# Patient Record
Sex: Male | Born: 1960 | Hispanic: Yes | Marital: Married | Smoking: Never smoker
Health system: Southern US, Community
[De-identification: ages and names within clinical notes are randomized; demographics above are authoritative.]

## PROBLEM LIST (undated history)

## (undated) DIAGNOSIS — I10 Essential (primary) hypertension: Secondary | ICD-10-CM

## (undated) DIAGNOSIS — E119 Type 2 diabetes mellitus without complications: Secondary | ICD-10-CM

## (undated) DIAGNOSIS — I83892 Varicose veins of left lower extremities with other complications: Secondary | ICD-10-CM

## (undated) HISTORY — PX: KNEE SURGERY: SHX244

## (undated) HISTORY — PX: VARICOSE VEIN SURGERY: SHX832

## (undated) HISTORY — DX: Type 2 diabetes mellitus without complications: E11.9

---

## 2017-10-06 ENCOUNTER — Emergency Department (INDEPENDENT_AMBULATORY_CARE_PROVIDER_SITE_OTHER): Payer: Self-pay

## 2017-10-06 ENCOUNTER — Encounter: Payer: Self-pay | Admitting: *Deleted

## 2017-10-06 ENCOUNTER — Emergency Department
Admission: EM | Admit: 2017-10-06 | Discharge: 2017-10-06 | Disposition: A | Discharge status: Home / Self Care | Payer: Self-pay | Source: Home / Self Care

## 2017-10-06 DIAGNOSIS — R918 Other nonspecific abnormal finding of lung field: Secondary | ICD-10-CM

## 2017-10-06 DIAGNOSIS — J189 Pneumonia, unspecified organism: Secondary | ICD-10-CM

## 2017-10-06 HISTORY — DX: Essential (primary) hypertension: I10

## 2017-10-06 MED ORDER — LEVOFLOXACIN 500 MG PO TABS: 500.0000 mg | ORAL_TABLET | Freq: Every day | ORAL | 0 refills | Status: DC

## 2017-10-06 MED ORDER — BENZONATATE 100 MG PO CAPS: 100.0000 mg | ORAL_CAPSULE | Freq: Three times a day (TID) | ORAL | 0 refills | Status: DC

## 2017-10-06 NOTE — Discharge Instructions (Signed)
You need to have a repeat chest xray in 4 weeks.  See your Physician for recheck

## 2017-10-06 NOTE — ED Triage Notes (Signed)
Pt c/o productive cough  X 3 wks. He stopped Lisinopril 2 wks ago per his PCP recommendation without relief. Denies fever.

## 2017-10-06 NOTE — ED Provider Notes (Signed)
[facility]    CSN: [id number] Arrival date & time: 10/06/17  [id number]     History   Chief Complaint Chief Complaint  Patient presents with  . Cough    HPI Frank Mcclain is a 57 y.o. male.   The history is provided by the patient. No language interpreter was used.  Cough  Cough characteristics:  Productive Sputum characteristics:  Nondescript Severity:  Moderate Onset quality:  Gradual Duration:  3 weeks Timing:  Constant Chronicity:  New Smoker: no   Context: not occupational exposure   Relieved by:  Nothing Worsened by:  Nothing Ineffective treatments:  None tried Pt has had a cough for several weeks.  Pt's MD stopped his lisinopril 3 weeks ago.  Pt complains of continued cough  Past Medical History:  Diagnosis Date  . Hypertension     There are no active problems to display for this patient.   History reviewed. No pertinent surgical history.     Home Medications    Prior to Admission medications   Medication Sig Start Date End Date Taking? Authorizing Provider  DICLOFENAC PO Take by mouth.   Yes [provider]  hydrochlorothiazide (MICROZIDE) 12.5 MG capsule Take 12.5 mg by mouth daily.   Yes [provider]  UNKNOWN TO PATIENT    Yes [provider]  levofloxacin (LEVAQUIN) 500 MG tablet Take 1 tablet (500 mg total) by mouth daily. 10/06/17   Elson AreasSofia, [place] Liskey K, PA-C    Family History History reviewed. No pertinent family history.  Social History Social History   Tobacco Use  . Smoking status: Never Smoker  . Smokeless tobacco: Never Used  Substance Use Topics  . Alcohol use: No    Frequency: Never  . Drug use: No     Allergies   Patient has no known allergies.   Review of Systems Review of Systems  Respiratory: Positive for cough.   All other systems reviewed and are negative.    Physical Exam Triage Vital Signs ED Triage Vitals  Enc Vitals Group     BP 10/06/17 1023 (!) 144/86     Pulse  Rate 10/06/17 1023 85     Resp 10/06/17 1023 18     Temp 10/06/17 1023 (!) 97.3 F (36.3 C)     Temp Source 10/06/17 1023 Oral     SpO2 10/06/17 1023 97 %     Weight 10/06/17 1024 248 lb (112.5 kg)     Height 10/06/17 1024 5\' 9"  (1.753 m)     Head Circumference --      Peak Flow --      Pain Score 10/06/17 1024 0     Pain Loc --      Pain Edu? --      Excl. in [place]? --    No data found.  Updated Vital Signs BP (!) 144/86 (BP Location: Right Arm)   Pulse 85   Temp (!) 97.3 F (36.3 C) (Oral)   Resp 18   Ht 5\' 9"  (1.753 m)   Wt 248 lb (112.5 kg)   SpO2 97%   BMI 36.62 kg/m   Visual Acuity Right Eye Distance:   Left Eye Distance:   Bilateral Distance:    Right Eye Near:   Left Eye Near:    Bilateral Near:     Physical Exam  Constitutional: He appears well-developed and well-nourished.  HENT:  Head: Normocephalic and atraumatic.  Right Ear: External ear normal.  Left Ear: External ear normal.  Mouth/Throat: Oropharynx is clear and moist.  Eyes: Conjunctivae are normal.  Neck: Neck supple.  Cardiovascular: Normal rate and regular rhythm.  No murmur heard. Pulmonary/Chest: Effort normal and breath sounds normal. No respiratory distress.  Abdominal: Soft. There is no tenderness.  Musculoskeletal: He exhibits no edema.  Neurological: He is alert.  Skin: Skin is warm and dry.  Psychiatric: He has a normal mood and affect.  Nursing note and vitals reviewed.    UC Treatments / Results  Labs (all labs ordered are listed, but only abnormal results are displayed) Labs Reviewed - No data to display  EKG  EKG Interpretation None       Radiology Dg Chest 2 View  Result Date: 10/06/2017 CLINICAL DATA:  Three weeks of productive cough. History of hypertension. Nonsmoker. EXAM: CHEST  2 VIEW COMPARISON:  None in PACs FINDINGS: The lungs are adequately inflated. The interstitial markings are coarse. There is no alveolar infiltrate. There is no pleural effusion. The  heart and pulmonary vascularity are normal. The trachea is midline. The bony thorax exhibits no acute abnormality. IMPRESSION: No alveolar pneumonia nor pulmonary edema. Mild interstitial prominence in the lower lobes which may be acute or chronic. If acute this may reflect acute bronchitis or early interstitial pneumonia. If chronic it may reflect chronic bronchitis or interstitial lung disease. Followup PA and lateral chest X-ray is recommended in 3-4 weeks following trial of antibiotic therapy to ensure resolution and exclude underlying malignancy. Electronically Signed   By: David  [place] M.D.   On: 10/06/2017 11:13    Procedures Procedures (including critical care time)  Medications Ordered in UC Medications - No data to display   Initial Impression / Assessment and Plan / UC Course  I have reviewed the triage vital signs and the nursing notes.  Pertinent labs & imaging results that were available during my care of the patient were reviewed by me and considered in my medical decision making (see chart for details).     MDM  Chest xray reviewed, Pt counseled on results.  Pt advised of need for repeat chest xray in 4 weeks.  Pt given rx for levaquin and tessalon  Final Clinical Impressions(s) / UC Diagnoses   Final diagnoses:  Community acquired pneumonia, unspecified laterality    ED Discharge Orders        Ordered    levofloxacin (LEVAQUIN) 500 MG tablet  Daily     10/06/17 1152    benzonatate (TESSALON) 100 MG capsule  Every 8 hours     10/06/17 1205     An After Visit Summary was printed and given to the patient.  Controlled Substance Prescriptions [place] Controlled Substance Registry consulted? Not Applicable   Elson Areas, [place] 10/06/17 1207

## 2018-03-17 DIAGNOSIS — S83206A Unspecified tear of unspecified meniscus, current injury, right knee, initial encounter: Secondary | ICD-10-CM | POA: Insufficient documentation

## 2018-11-26 DIAGNOSIS — G894 Chronic pain syndrome: Secondary | ICD-10-CM | POA: Insufficient documentation

## 2018-11-26 DIAGNOSIS — M5136 Other intervertebral disc degeneration, lumbar region: Secondary | ICD-10-CM | POA: Insufficient documentation

## 2018-11-26 DIAGNOSIS — M48061 Spinal stenosis, lumbar region without neurogenic claudication: Secondary | ICD-10-CM | POA: Insufficient documentation

## 2018-11-26 DIAGNOSIS — M1711 Unilateral primary osteoarthritis, right knee: Secondary | ICD-10-CM | POA: Insufficient documentation

## 2019-08-24 ENCOUNTER — Other Ambulatory Visit: Payer: Self-pay

## 2019-08-24 ENCOUNTER — Encounter: Payer: Self-pay | Admitting: Emergency Medicine

## 2019-08-24 ENCOUNTER — Emergency Department
Admission: EM | Admit: 2019-08-24 | Discharge: 2019-08-24 | Disposition: A | Discharge status: Home / Self Care | Payer: Self-pay | Source: Home / Self Care | Attending: Family Medicine | Admitting: Family Medicine

## 2019-08-24 DIAGNOSIS — H6122 Impacted cerumen, left ear: Secondary | ICD-10-CM

## 2019-08-24 NOTE — ED Triage Notes (Signed)
LT ear tinnitus x 3 days, denies pain or dizziness

## 2019-08-24 NOTE — ED Provider Notes (Signed)
[facility]    CSN: [id number] Arrival date & time: 08/24/19  6948      History   Chief Complaint Chief Complaint  Patient presents with  . Tinnitus    HPI Frank Mcclain is a 59 y.o. male.   Patient complains of ringing in his left ear for 3 days without pain.  He describes it as a swishing sound.  He denies nasal congestion, fever, and no recent URI symptoms.  The history is provided by the patient.    Past Medical History:  Diagnosis Date  . Hypertension     There are no problems to display for this patient.   History reviewed. No pertinent surgical history.     Home Medications    Prior to Admission medications   Medication Sig Start Date End Date Taking? Authorizing Provider  DICLOFENAC PO Take by mouth.    [provider]  hydrochlorothiazide (MICROZIDE) 12.5 MG capsule Take 12.5 mg by mouth daily.    [provider]  UNKNOWN TO PATIENT     [provider]    Family History Family History  Problem Relation Age of Onset  . Hypertension Father     Social History Social History   Tobacco Use  . Smoking status: Never Smoker  . Smokeless tobacco: Never Used  Substance Use Topics  . Alcohol use: Yes  . Drug use: No     Allergies   Patient has no known allergies.   Review of Systems Review of Systems No sore throat No cough No pleuritic pain No wheezing No nasal congestion No post-nasal drainage No sinus pain/pressure No itchy/red eyes No earache, but has tinnitus left ear No hemoptysis No SOB No fever/chills No nausea No vomiting No abdominal pain No diarrhea No urinary symptoms No skin rash No fatigue No myalgias No headache   Physical Exam Triage Vital Signs ED Triage Vitals  Enc Vitals Group     BP 08/24/19 0906 (!) 143/87     Pulse Rate 08/24/19 0906 89     Resp --      Temp 08/24/19 0906 98.6 F (37 C)     Temp Source 08/24/19 0906 Oral     SpO2 08/24/19 0906 96 %   Weight 08/24/19 0907 260 lb (117.9 kg)     Height 08/24/19 0907 5\' 11"  (1.803 m)     Head Circumference --      Peak Flow --      Pain Score 08/24/19 0907 0     Pain Loc --      Pain Edu? --      Excl. in [place]? --    No data found.  Updated Vital Signs BP (!) 143/87 (BP Location: Right Arm)   Pulse 89   Temp 98.6 F (37 C) (Oral)   Ht 5\' 11"  (1.803 m)   Wt 117.9 kg   SpO2 96%   BMI 36.26 kg/m   Visual Acuity Right Eye Distance:   Left Eye Distance:   Bilateral Distance:    Right Eye Near:   Left Eye Near:    Bilateral Near:     Physical Exam Vitals and nursing note reviewed.  Constitutional:      General: He is not in acute distress.    Appearance: He is obese.  HENT:     Head: Normocephalic.     Right Ear: Tympanic membrane, ear canal and external ear normal.     Left Ear: External ear normal. There is  impacted cerumen.     Ears:     Comments: Post lavage, left canal and tympanic membrane appear normal    Nose: Nose normal.     Mouth/Throat:     Mouth: Mucous membranes are moist.  Eyes:     Pupils: Pupils are equal, round, and reactive to light.  Cardiovascular:     Rate and Rhythm: Normal rate.  Pulmonary:     Effort: Pulmonary effort is normal.  Musculoskeletal:     Cervical back: Normal range of motion.  Skin:    General: Skin is warm and dry.     Findings: No rash.  Neurological:     Mental Status: He is alert.      UC Treatments / Results  Labs (all labs ordered are listed, but only abnormal results are displayed) Labs Reviewed - No data to display  EKG   Radiology No results found.  Procedures Procedures (including critical care time)  Medications Ordered in UC Medications - No data to display  Initial Impression / Assessment and Plan / UC Course  I have reviewed the triage vital signs and the nursing notes.  Pertinent labs & imaging results that were available during my care of the patient were reviewed by me and considered in my  medical decision making (see chart for details).    Return for worsening symptoms.   Final Clinical Impressions(s) / UC Diagnoses   Final diagnoses:  Impacted cerumen of left ear   Discharge Instructions   None    ED Prescriptions    None        Lattie Haw, MD 08/24/19 [id number]

## 2019-10-31 ENCOUNTER — Emergency Department
Admission: EM | Admit: 2019-10-31 | Discharge: 2019-10-31 | Disposition: A | Discharge status: Home / Self Care | Payer: Self-pay | Source: Home / Self Care

## 2019-10-31 ENCOUNTER — Emergency Department: Payer: Self-pay

## 2019-10-31 ENCOUNTER — Emergency Department (INDEPENDENT_AMBULATORY_CARE_PROVIDER_SITE_OTHER): Payer: Self-pay

## 2019-10-31 ENCOUNTER — Other Ambulatory Visit: Payer: Self-pay

## 2019-10-31 DIAGNOSIS — I83892 Varicose veins of left lower extremities with other complications: Secondary | ICD-10-CM

## 2019-10-31 DIAGNOSIS — R05 Cough: Secondary | ICD-10-CM

## 2019-10-31 DIAGNOSIS — I803 Phlebitis and thrombophlebitis of lower extremities, unspecified: Secondary | ICD-10-CM

## 2019-10-31 DIAGNOSIS — R053 Chronic cough: Secondary | ICD-10-CM

## 2019-10-31 HISTORY — DX: Varicose veins of left lower extremity with other complications: I83.892

## 2019-10-31 MED ORDER — DOXYCYCLINE HYCLATE 100 MG PO CAPS: 100.0000 mg | ORAL_CAPSULE | Freq: Two times a day (BID) | ORAL | 0 refills | Status: DC

## 2019-10-31 NOTE — ED Provider Notes (Signed)
[facility]    CSN: [id number] Arrival date & time: 10/31/19  1003      History   Chief Complaint Chief Complaint  Patient presents with  . Leg Pain  . Cough    HPI Frank Mcclain is a 59 y.o. male.   HPI Frank Mcclain is a 59 y.o. male presenting to UC with family member c/o Left leg pain and swelling of his varicose veins of the last 1 week.  He has been less active due to the pain. He has also noticed redness and warmth of his left lower leg. Hx of surgery on his lower leg about 18 years ago but he does not recall the specifics of the surgery.    Pt also reports a mildly productive cough with yellow phlegm over the last 6 months, worse this past few weeks. Denies fever, chills, chest pain or SOB. No medication tried for his cough.  Denies chest pain, SOB, or palpitations.     Past Medical History:  Diagnosis Date  . Hypertension   . Varicose veins of left leg with edema     There are no problems to display for this patient.   Past Surgical History:  Procedure Laterality Date  . KNEE SURGERY    . VARICOSE VEIN SURGERY         Home Medications    Prior to Admission medications   Medication Sig Start Date End Date Taking? Authorizing Provider  amLODipine (NORVASC) 10 MG tablet Take by mouth. 12/12/17  Yes [provider]  gabapentin (NEURONTIN) 300 MG capsule Take by mouth. 11/26/18  Yes [provider]  DICLOFENAC PO Take by mouth.    [provider]  doxycycline (VIBRAMYCIN) 100 MG capsule Take 1 capsule (100 mg total) by mouth 2 (two) times daily. One po bid x 7 days 10/31/19   Lurene Shadow, PA-C  hydrochlorothiazide (MICROZIDE) 12.5 MG capsule Take 12.5 mg by mouth daily.    [provider]  UNKNOWN TO PATIENT     [provider]    Family History Family History  Problem Relation Age of Onset  . Hypertension Father     Social History Social History   Tobacco Use  . Smoking status: Never  Smoker  . Smokeless tobacco: Never Used  Substance Use Topics  . Alcohol use: Yes  . Drug use: No     Allergies   Patient has no known allergies.   Review of Systems Review of Systems  Constitutional: Negative for chills and fever.  HENT: Positive for congestion. Negative for ear pain, sore throat, trouble swallowing and voice change.   Respiratory: Positive for cough. Negative for shortness of breath.   Cardiovascular: Positive for leg swelling (Left). Negative for chest pain and palpitations.  Gastrointestinal: Negative for abdominal pain, diarrhea, nausea and vomiting.  Musculoskeletal: Positive for joint swelling and myalgias. Negative for arthralgias and back pain.  Skin: Positive for color change. Negative for rash.  All other systems reviewed and are negative.    Physical Exam Triage Vital Signs ED Triage Vitals [10/31/19 1026]  Enc Vitals Group     BP      Pulse      Resp      Temp      Temp src      SpO2      Weight 240 lb (108.9 kg)     Height 5\' 11"  (1.803 m)     Head Circumference  Peak Flow      Pain Score 5     Pain Loc      Pain Edu?      Excl. in [place]?    No data found.  Updated Vital Signs BP 109/76 (BP Location: Left Arm)   Pulse 100   Temp 98.2 F (36.8 C) (Oral)   Resp 20   Ht 5\' 11"  (1.803 m)   Wt 240 lb (108.9 kg)   SpO2 96%   BMI 33.47 kg/m   Visual Acuity Right Eye Distance:   Left Eye Distance:   Bilateral Distance:    Right Eye Near:   Left Eye Near:    Bilateral Near:     Physical Exam Vitals and nursing note reviewed.  Constitutional:      General: He is not in acute distress.    Appearance: Normal appearance. He is well-developed. He is obese. He is not ill-appearing, toxic-appearing or diaphoretic.  HENT:     Head: Normocephalic and atraumatic.  Cardiovascular:     Rate and Rhythm: Normal rate and regular rhythm.     Pulses:          Dorsalis pedis pulses are 2+ on the left side.  Pulmonary:     Effort:  Pulmonary effort is normal. No respiratory distress.     Breath sounds: Normal breath sounds. No stridor. No wheezing, rhonchi or rales.  Musculoskeletal:        General: Swelling and tenderness present. Normal range of motion.     Cervical back: Normal range of motion.     Comments: Right lower leg: normal exam Left lower leg: multiple prominent torturous varicose veins. Erythema and warmth to anterior lower leg. Tender. Moderate edema compared to Right leg.  Full ROM Left knee, foot and toes. No bony tenderness.  Skin:    General: Skin is warm and dry.     Capillary Refill: Capillary refill takes less than 2 seconds.  Neurological:     Mental Status: He is alert and oriented to person, place, and time.  Psychiatric:        Behavior: Behavior normal.      UC Treatments / Results  Labs (all labs ordered are listed, but only abnormal results are displayed) Labs Reviewed - No data to display  EKG   Radiology DG Chest 2 View  Result Date: 10/31/2019 CLINICAL DATA:  Chronic cough for 6 months. EXAM: CHEST - 2 VIEW COMPARISON:  10/06/2017 FINDINGS: Mild right hemidiaphragm elevation. Midline trachea. Normal heart size and mediastinal contours. No pleural effusion or pneumothorax. Clear lungs. IMPRESSION: No acute cardiopulmonary disease. Electronically Signed   By: 12/06/2017 M.D.   On: 10/31/2019 11:35   11/02/2019 Venous Img Lower Unilateral Left  Result Date: 10/31/2019 CLINICAL DATA:  59 year old with pain and swelling in the left lower extremity. History of previous treatment for varicose veins. EXAM: LEFT LOWER EXTREMITY VENOUS DOPPLER ULTRASOUND TECHNIQUE: Gray-scale sonography with graded compression, as well as color Doppler and duplex ultrasound were performed to evaluate the lower extremity deep venous systems from the level of the common femoral vein and including the common femoral, femoral, profunda femoral, popliteal and calf veins including the posterior tibial, peroneal and  gastrocnemius veins when visible. The superficial great saphenous vein was also interrogated. Spectral Doppler was utilized to evaluate flow at rest and with distal augmentation maneuvers in the common femoral, femoral and popliteal veins. COMPARISON:  None. FINDINGS: Contralateral Common Femoral Vein: Respiratory phasicity is normal and symmetric  with the symptomatic side. No evidence of thrombus. Normal compressibility. Common Femoral Vein: No evidence of thrombus. Normal compressibility, respiratory phasicity and response to augmentation. Saphenofemoral Junction: Not visualized and likely related to previous surgery. Profunda Femoral Vein: No evidence of thrombus. Normal compressibility and flow on color Doppler imaging. Femoral Vein: No evidence of thrombus. Normal compressibility, respiratory phasicity and response to augmentation. Popliteal Vein: No evidence of thrombus. Normal compressibility, respiratory phasicity and response to augmentation. Calf Veins: Visualized left deep calf veins are patent without thrombus. Other Findings: Left short saphenous vein is compressible. Multiple superficial varicosities in left calf. Majority of these varicose veins demonstrate normal compressibility. There is small amount of nonocclusive thrombus involving one of varicosities. IMPRESSION: 1.  Negative for deep venous thrombosis in left lower extremity. 2. Positive for nonocclusive superficial thrombus involving a varicose vein in the left calf. Multiple varicosities in the left calf compatible with underlying superficial venous insufficiency. Electronically Signed   By: Markus Daft M.D.   On: 10/31/2019 12:42    Procedures Procedures (including critical care time)  Medications Ordered in UC Medications - No data to display  Initial Impression / Assessment and Plan / UC Course  I have reviewed the triage vital signs and the nursing notes.  Pertinent labs & imaging results that were available during my care of  the patient were reviewed by me and considered in my medical decision making (see chart for details).     Discussed imaging with pt and family member Cough- may try OTC antihistamines as cough could be due to environmental allergies (Spring-high pollen count and lots of rain recently). No evidence of bacterial infection at this time.   Encouraged f/u with cardiovascular surgeon for further evaluation and treatment of severe varicose veins Advised pt he may need a referral from his PCP Discussed symptoms that warrant emergent care in the ED. AVS provided.  Final Clinical Impressions(s) / UC Diagnoses   Final diagnoses:  Phlebitis of left leg  Varicose veins of left leg with edema  Chronic cough   Discharge Instructions   None    ED Prescriptions    Medication Sig Dispense Auth. Provider   doxycycline (VIBRAMYCIN) 100 MG capsule Take 1 capsule (100 mg total) by mouth 2 (two) times daily. One po bid x 7 days 14 capsule Noe Gens, [place]     PDMP not reviewed this encounter.   Noe Gens, [place] 10/31/19 1711

## 2019-10-31 NOTE — ED Triage Notes (Signed)
Pt has varicose veins in left leg, and at night it hurts and swells.  Very painful and his activity has decreased the last week.   Pt has had a bad cough for about 6 months.  Has thick yellowish green mucous most of the time.

## 2019-11-04 ENCOUNTER — Other Ambulatory Visit: Payer: Self-pay

## 2019-11-04 ENCOUNTER — Telehealth: Payer: Self-pay

## 2019-11-04 ENCOUNTER — Emergency Department
Admission: EM | Admit: 2019-11-04 | Discharge: 2019-11-04 | Disposition: A | Discharge status: Home / Self Care | Payer: Self-pay | Source: Home / Self Care

## 2019-11-04 ENCOUNTER — Telehealth: Payer: Self-pay | Admitting: Emergency Medicine

## 2019-11-04 DIAGNOSIS — R7989 Other specified abnormal findings of blood chemistry: Secondary | ICD-10-CM

## 2019-11-04 DIAGNOSIS — R059 Cough, unspecified: Secondary | ICD-10-CM

## 2019-11-04 DIAGNOSIS — R42 Dizziness and giddiness: Secondary | ICD-10-CM

## 2019-11-04 DIAGNOSIS — R739 Hyperglycemia, unspecified: Secondary | ICD-10-CM

## 2019-11-04 DIAGNOSIS — R1110 Vomiting, unspecified: Secondary | ICD-10-CM

## 2019-11-04 DIAGNOSIS — R05 Cough: Secondary | ICD-10-CM

## 2019-11-04 DIAGNOSIS — R5383 Other fatigue: Secondary | ICD-10-CM

## 2019-11-04 LAB — CBC WITH DIFFERENTIAL/PLATELET
Absolute Monocytes: 392 cells/uL (ref 200–950)
Basophils Absolute: 20 cells/uL (ref 0–200)
Basophils Relative: 0.5 %
Eosinophils Absolute: 0 cells/uL — ABNORMAL LOW (ref 15–500)
Eosinophils Relative: 0 %
HCT: 50.9 % — ABNORMAL HIGH (ref 38.5–50.0)
Hemoglobin: 18.3 g/dL — ABNORMAL HIGH (ref 13.2–17.1)
Lymphs Abs: 2092 cells/uL (ref 850–3900)
MCH: 35.8 pg — ABNORMAL HIGH (ref 27.0–33.0)
MCHC: 36 g/dL (ref 32.0–36.0)
MCV: 99.6 fL (ref 80.0–100.0)
MPV: 10.7 fL (ref 7.5–12.5)
Monocytes Relative: 9.8 %
Neutro Abs: 1496 cells/uL — ABNORMAL LOW (ref 1500–7800)
Neutrophils Relative %: 37.4 %
Platelets: 136 10*3/uL — ABNORMAL LOW (ref 140–400)
RBC: 5.11 10*6/uL (ref 4.20–5.80)
RDW: 12.2 % (ref 11.0–15.0)
Total Lymphocyte: 52.3 %
WBC: 4 10*3/uL (ref 3.8–10.8)

## 2019-11-04 LAB — COMPLETE METABOLIC PANEL WITH GFR
AG Ratio: 1.4 (calc) (ref 1.0–2.5)
ALT: 184 U/L — ABNORMAL HIGH (ref 9–46)
AST: 142 U/L — ABNORMAL HIGH (ref 10–35)
Albumin: 3.8 g/dL (ref 3.6–5.1)
Alkaline phosphatase (APISO): 128 U/L (ref 35–144)
BUN: 15 mg/dL (ref 7–25)
CO2: 23 mmol/L (ref 20–32)
Calcium: 8.9 mg/dL (ref 8.6–10.3)
Chloride: 99 mmol/L (ref 98–110)
Creat: 0.77 mg/dL (ref 0.70–1.33)
GFR, Est African American: 117 mL/min/{1.73_m2} (ref >=60)
GFR, Est Non African American: 101 mL/min/{1.73_m2} (ref >=60)
Globulin: 2.8 g/dL (calc) (ref 1.9–3.7)
Glucose, Bld: 398 mg/dL — ABNORMAL HIGH (ref 65–99)
Potassium: 3.7 mmol/L (ref 3.5–5.3)
Sodium: 134 mmol/L — ABNORMAL LOW (ref 135–146)
Total Bilirubin: 1.4 mg/dL — ABNORMAL HIGH (ref 0.2–1.2)
Total Protein: 6.6 g/dL (ref 6.1–8.1)

## 2019-11-04 LAB — EXTRA SPECIMEN

## 2019-11-04 LAB — BRAIN NATRIURETIC PEPTIDE: Brain Natriuretic Peptide: 24 pg/mL (ref <100)

## 2019-11-04 MED ORDER — GENERIC EXTERNAL MEDICATION
Status: DC
Start: ? — End: 2019-11-04

## 2019-11-04 MED ORDER — AMLODIPINE BESYLATE 10 MG PO TABS: 10.0000 mg | ORAL_TABLET | Freq: Every day | ORAL | 0 refills | Status: DC

## 2019-11-04 MED ORDER — SODIUM CHLORIDE 0.9 % IV SOLN
10.00 | INTRAVENOUS | Status: DC
Start: ? — End: 2019-11-04

## 2019-11-04 NOTE — Discharge Instructions (Signed)
  It is important to follow up with family medicine for recheck of symptoms next week and for ongoing healthcare needs including blood pressure management.  Call 911 or go to emergency department for further evaluation if you develop chest pain, trouble breathing, passing out, or other new concerning symptoms develop.

## 2019-11-04 NOTE — ED Triage Notes (Signed)
Pt c/o cogh, vomiting, dizziness and fatigue x 1 week. Did receive J&J covid vaccine last thurs. Wife thinks he may have had some covid sxs prior to getting vaccine. No known exposure though.

## 2019-11-04 NOTE — ED Notes (Signed)
STAT LAB pick up at 2:30pm

## 2019-11-04 NOTE — ED Provider Notes (Signed)
Frank Mcclain CARE    CSN: [id number] Arrival date & time: 11/04/19  1237      History   Chief Complaint Chief Complaint  Patient presents with  . Cough  . Emesis  . Fatigue    HPI Frank Mcclain is a 59 y.o. male.   HPI  Frank Mcclain is a 59 y.o. male presenting to UC with wife with c/o 1 week of worsening minimally productive cough, dizziness, vomiting after cough "mainly phlegm" and fatigue.  He received his J&J vaccine the day pt started to feel a little fatigued last Thursday, 3/25.  She is unsure if his symptoms are from the vaccine or if he was sick prior to the vaccine.  No known exposure to Covid. No recent travel.  Wife states she has had a mild cough and fatigue but she gets chemo treatments on Thursdays so she figured she was fatigued from her chemo.   Pt was seen at [facility] Monday, 3/29 for reported chronic cough of 6 months and worsening Left lower leg swelling and varicose veins.  No mention of the J&J vaccine at that time.  Pt was started on doxycycline on Monday 3/29 for phlebitis of Left lower leg, concern for cellulitis.Pt denies chest pain, SOB, fever, or chills.   Past Medical History:  Diagnosis Date  . Hypertension   . Varicose veins of left leg with edema     There are no problems to display for this patient.   Past Surgical History:  Procedure Laterality Date  . KNEE SURGERY    . VARICOSE VEIN SURGERY         Home Medications    Prior to Admission medications   Medication Sig Start Date End Date Taking? Authorizing Provider  amLODipine (NORVASC) 10 MG tablet Take 1 tablet (10 mg total) by mouth daily for 10 days. 11/04/19 11/14/19  Noe Gens, PA-C  DICLOFENAC PO Take by mouth.    [provider]  doxycycline (VIBRAMYCIN) 100 MG capsule Take 1 capsule (100 mg total) by mouth 2 (two) times daily. One po bid x 7 days 10/31/19   Noe Gens, PA-C  gabapentin (NEURONTIN) 300 MG capsule Take by mouth. 11/26/18   [provider]  hydrochlorothiazide (MICROZIDE) 12.5 MG capsule Take 12.5 mg by mouth daily.    [provider]  UNKNOWN TO PATIENT     [provider]    Family History Family History  Problem Relation Age of Onset  . Hypertension Father     Social History Social History   Tobacco Use  . Smoking status: Never Smoker  . Smokeless tobacco: Never Used  Substance Use Topics  . Alcohol use: Yes  . Drug use: No     Allergies   Patient has no known allergies.   Review of Systems Review of Systems  Constitutional: Positive for fatigue. Negative for chills and fever.  HENT: Positive for congestion. Negative for ear pain, sore throat, trouble swallowing and voice change.   Respiratory: Positive for cough. Negative for shortness of breath.   Cardiovascular: Negative for chest pain and palpitations.  Gastrointestinal: Positive for vomiting. Negative for abdominal pain, diarrhea and nausea.  Musculoskeletal: Negative for arthralgias, back pain and myalgias.  Skin: Negative for rash.  Neurological: Positive for dizziness (mild). Negative for light-headedness and headaches.  All other systems reviewed and are negative.    Physical Exam Triage Vital Signs ED Triage Vitals  Enc Vitals Group     BP 11/04/19  1312 115/81     Pulse Rate 11/04/19 1312 95     Resp 11/04/19 1312 18     Temp 11/04/19 1312 98.1 F (36.7 C)     Temp Source 11/04/19 1312 Oral     SpO2 11/04/19 1312 95 %     Weight --      Height --      Head Circumference --      Peak Flow --      Pain Score 11/04/19 1314 0     Pain Loc --      Pain Edu? --      Excl. in GC? --    No data found.  Updated Vital Signs BP 115/81 (BP Location: Left Arm)   Pulse 95   Temp 98.1 F (36.7 C) (Oral)   Resp 18   SpO2 95%   Visual Acuity Right Eye Distance:   Left Eye Distance:   Bilateral Distance:    Right Eye Near:   Left Eye Near:    Bilateral Near:     Physical Exam Vitals and nursing  note reviewed.  Constitutional:      General: He is not in acute distress.    Appearance: Normal appearance. He is well-developed. He is not ill-appearing, toxic-appearing or diaphoretic.  HENT:     Head: Normocephalic and atraumatic.     Right Ear: Tympanic membrane and ear canal normal.     Left Ear: Tympanic membrane and ear canal normal.     Nose: Nose normal.     Right Sinus: No maxillary sinus tenderness or frontal sinus tenderness.     Left Sinus: No maxillary sinus tenderness or frontal sinus tenderness.     Mouth/Throat:     Lips: Pink.     Mouth: Mucous membranes are moist.     Pharynx: Oropharynx is clear. Uvula midline.  Cardiovascular:     Rate and Rhythm: Normal rate and regular rhythm.  Pulmonary:     Effort: Pulmonary effort is normal. No respiratory distress.     Breath sounds: Normal breath sounds.  Abdominal:     General: There is no distension.     Palpations: Abdomen is soft.     Tenderness: There is no abdominal tenderness.  Musculoskeletal:        General: Normal range of motion.     Cervical back: Normal range of motion.  Skin:    General: Skin is warm and dry.  Neurological:     Mental Status: He is alert and oriented to person, place, and time.  Psychiatric:        Behavior: Behavior normal.      UC Treatments / Results  Labs (all labs ordered are listed, but only abnormal results are displayed) Labs Reviewed  CBC WITH DIFFERENTIAL/PLATELET - Abnormal; Notable for the following components:      Result Value   Hemoglobin 18.3 (*)    HCT 50.9 (*)    MCH 35.8 (*)    Platelets 136 (*)    Neutro Abs 1,496 (*)    Eosinophils Absolute 0 (*)    All other components within normal limits  COMPLETE METABOLIC PANEL WITH GFR - Abnormal; Notable for the following components:   Glucose, Bld 398 (*)    Sodium 134 (*)    Total Bilirubin 1.4 (*)    AST 142 (*)    ALT 184 (*)    All other components within normal limits  NOVEL CORONAVIRUS, NAA  BRAIN  NATRIURETIC  PEPTIDE  EXTRA SPECIMEN    EKG   Radiology No results found.  Procedures Procedures (including critical care time)  Medications Ordered in UC Medications - No data to display  Initial Impression / Assessment and Plan / UC Course  I have reviewed the triage vital signs and the nursing notes.  Pertinent labs & imaging results that were available during my care of the patient were reviewed by me and considered in my medical decision making (see chart for details).     Concern for possible Covid. Covid test: pending.  Labs: glucose- 398, pt has no prior hx of diabetes LFTs also elevated Pt was instructed to go to the hospital for further evaluation as pt does not currently have a PCP he f/u with.  Final Clinical Impressions(s) / UC Diagnoses   Final diagnoses:  Cough  Fatigue, unspecified type  Post-tussive vomiting  Dizziness     Discharge Instructions      It is important to follow up with family medicine for recheck of symptoms next week and for ongoing healthcare needs including blood pressure management.  Call 911 or go to emergency department for further evaluation if you develop chest pain, trouble breathing, passing out, or other new concerning symptoms develop.    ED Prescriptions    Medication Sig Dispense Auth. Provider   amLODipine (NORVASC) 10 MG tablet Take 1 tablet (10 mg total) by mouth daily for 10 days. 10 tablet Lurene Shadow, PA-C     PDMP not reviewed this encounter.   Lurene Shadow, [place] 11/04/19 1754

## 2019-11-04 NOTE — Telephone Encounter (Signed)
Left msg for pt/emergency contact about elevated lab results. Per Denny Peon, pt should get further evaluated for blood sugar and liver enzyme levels.

## 2019-11-04 NOTE — Telephone Encounter (Signed)
Pts wife called back. Labs discussed. Strongly urged follow up at hospital. Pts wife agrees to take him in to [facility]

## 2019-11-05 LAB — NOVEL CORONAVIRUS, NAA: SARS-CoV-2, NAA: NOT DETECTED

## 2019-11-05 LAB — SARS-COV-2, NAA 2 DAY TAT

## 2019-11-05 MED ORDER — GENERIC EXTERNAL MEDICATION
Status: DC
Start: ? — End: 2019-11-05

## 2019-11-08 ENCOUNTER — Encounter: Payer: Self-pay | Admitting: Medical-Surgical

## 2019-11-08 ENCOUNTER — Ambulatory Visit (INDEPENDENT_AMBULATORY_CARE_PROVIDER_SITE_OTHER): Payer: Self-pay | Admitting: Medical-Surgical

## 2019-11-08 ENCOUNTER — Other Ambulatory Visit: Payer: Self-pay

## 2019-11-08 VITALS — BP 103/73 | HR 100 | Temp 98.0°F | Ht 67.0 in | Wt 237.7 lb

## 2019-11-08 DIAGNOSIS — G479 Sleep disorder, unspecified: Secondary | ICD-10-CM

## 2019-11-08 DIAGNOSIS — R600 Localized edema: Secondary | ICD-10-CM

## 2019-11-08 DIAGNOSIS — M25561 Pain in right knee: Secondary | ICD-10-CM

## 2019-11-08 DIAGNOSIS — J189 Pneumonia, unspecified organism: Secondary | ICD-10-CM

## 2019-11-08 DIAGNOSIS — I10 Essential (primary) hypertension: Secondary | ICD-10-CM

## 2019-11-08 DIAGNOSIS — I83812 Varicose veins of left lower extremities with pain: Secondary | ICD-10-CM

## 2019-11-08 DIAGNOSIS — E1165 Type 2 diabetes mellitus with hyperglycemia: Secondary | ICD-10-CM

## 2019-11-08 MED ORDER — GABAPENTIN 300 MG PO CAPS: 300.0000 mg | ORAL_CAPSULE | Freq: Two times a day (BID) | ORAL | 3 refills | Status: DC

## 2019-11-08 MED ORDER — AMLODIPINE BESYLATE 10 MG PO TABS: 10.0000 mg | ORAL_TABLET | Freq: Every day | ORAL | 3 refills | Status: DC

## 2019-11-08 MED ORDER — BLOOD GLUCOSE MONITOR KIT: PACK | 0 refills | Status: DC

## 2019-11-08 MED ORDER — FUROSEMIDE 20 MG PO TABS: 20.0000 mg | ORAL_TABLET | ORAL | 3 refills | Status: DC

## 2019-11-08 MED ORDER — HYDROCHLOROTHIAZIDE 25 MG PO TABS
25.0000 mg | ORAL_TABLET | ORAL | 3 refills | Status: DC
Start: 2019-11-08 — End: 2020-12-20

## 2019-11-08 MED ORDER — GLIPIZIDE 5 MG PO TABS: 5.0000 mg | ORAL_TABLET | Freq: Two times a day (BID) | ORAL | 3 refills | Status: DC

## 2019-11-08 MED ORDER — METFORMIN HCL 1000 MG PO TABS: 1000.0000 mg | ORAL_TABLET | Freq: Two times a day (BID) | ORAL | 3 refills | Status: DC

## 2019-11-08 NOTE — Progress Notes (Signed)
New Patient Office Visit  Subjective:  Patient ID: Frank Mcclain, male    DOB: 1961-07-10  Age: 59 y.o. MRN: [id number]  CC:  Chief Complaint  Patient presents with  . Establish Care  . Follow-up    [facility] 11/04/2019  . Diabetes  . Pneumonia    HPI Frank Mcclain presents to establish care and for hospital follow-up.  Patient was seen at [facility] on 11/04/2019 for new onset diabetes, bilateral pneumonia, and hypertension.   Diabetes-patient reports polyuria, polydipsia, dizziness, lethargy, and overall malaise prompting him to go to the hospital where his glucose level was at 600 on arrival. Started on Metformin and glipizide. Wife is diabetic and has been helping patient manage his blood sugars. She reports his fasting glucose yesterday morning was 220 with low readings of 150s throughout the afternoon. Fasting glucose this morning was 129. She reports he is a big fruit eater and they are working to adjust his diet   Pneumonia-also presented with chest tightness, severe cough and low-grade fever. Determined to have bilateral pneumonia, Covid negative. Currently on doxycycline twice daily for a 7-day course. Still running low-grade fevers approximately 99.5 resolved with as needed Tylenol. Endorses chills, continue chest tightness, severe productive cough with small amounts of hemoptysis. Denies shortness of breath but feels as if his breath is "taken away" when his cough is severe. Having some difficulty with sleeping at night.  Hypertension-blood pressure elevated at the hospital. Patient reports he ran out of his amlodipine and was given a 10-day supply on discharge. Not checking blood pressures at home, no cuff available.  Past Medical History:  Diagnosis Date  . Diabetes mellitus without complication ([place])   . Hypertension   . Varicose veins of left leg with edema     Past Surgical History:  Procedure Laterality Date  . KNEE SURGERY    . VARICOSE VEIN SURGERY      Family  History  Family history unknown: Yes    Social History   Socioeconomic History  . Marital status: Married    Spouse name: Not on file  . Number of children: Not on file  . Years of education: Not on file  . Highest education level: Not on file  Occupational History  . Not on file  Tobacco Use  . Smoking status: Never Smoker  . Smokeless tobacco: Never Used  Substance and Sexual Activity  . Alcohol use: Yes    Alcohol/week: 21.0 - 42.0 standard drinks    Types: 21 - 42 Cans of beer per week    Comment: 3-6 beers/day  . Drug use: No  . Sexual activity: Yes  Other Topics Concern  . Not on file  Social History Narrative  . Not on file   Social Determinants of Health   Financial Resource Strain:   . Difficulty of Paying Living Expenses:   Food Insecurity:   . Worried About Charity fundraiser in the Last Year:   . Arboriculturist in the Last Year:   Transportation Needs:   . Film/video editor (Medical):   Marland Kitchen Lack of Transportation (Non-Medical):   Physical Activity:   . Days of Exercise per Week:   . Minutes of Exercise per Session:   Stress:   . Feeling of Stress :   Social Connections:   . Frequency of Communication with Friends and Family:   . Frequency of Social Gatherings with Friends and Family:   . Attends Religious Services:   .  Active Member of Clubs or Organizations:   . Attends Archivist Meetings:   Marland Kitchen Marital Status:   Intimate Partner Violence:   . Fear of Current or Ex-Partner:   . Emotionally Abused:   Marland Kitchen Physically Abused:   . Sexually Abused:     ROS Review of Systems  Constitutional: Positive for chills and fever (low grade).  Respiratory: Positive for cough and chest tightness. Negative for shortness of breath and wheezing.   Cardiovascular: Positive for chest pain (left lower sternal borderf) and leg swelling. Negative for palpitations.  Endocrine: Positive for polydipsia and polyuria. Negative for polyphagia.  Genitourinary:  Positive for dysuria.    Objective:   Today's Vitals: BP 103/73   Pulse 100   Temp 98 F (36.7 C) (Oral)   Ht 5' 7"  (1.702 m)   Wt 237 lb 11.2 oz (107.8 kg)   SpO2 94%   BMI 37.23 kg/m   Physical Exam Vitals reviewed.  Constitutional:      General: He is not in acute distress.    Appearance: Normal appearance.  HENT:     Head: Normocephalic and atraumatic.  Cardiovascular:     Rate and Rhythm: Normal rate and regular rhythm.     Pulses: Normal pulses.     Heart sounds: Normal heart sounds. No murmur. No friction rub. No gallop.   Pulmonary:     Effort: Pulmonary effort is normal. No respiratory distress.     Comments: Strong cough triggered by deep breaths Musculoskeletal:     Left lower leg: Edema (mild) present.  Skin:    General: Skin is warm and dry.  Neurological:     Mental Status: He is alert and oriented to person, place, and time.  Psychiatric:        Mood and Affect: Mood normal.        Behavior: Behavior normal.        Thought Content: Thought content normal.        Judgment: Judgment normal.     Assessment & Plan:   1. Uncontrolled type 2 diabetes mellitus with hyperglycemia (HCC) Hemoglobin A1c 12.9 on labs drawn at [facility] on 11/04/2019. Continue glipizide 5 mg and Metformin 1000 mg twice daily, refills provided. Printed prescription for glucometer provided at patient request. They are self-pay so we are not sure if this will help with any kind of coverage for glucometer cost. - glipiZIDE (GLUCOTROL) 5 MG tablet; Take 1 tablet (5 mg total) by mouth in the morning and at bedtime.  Dispense: 180 tablet; Refill: 3 - metFORMIN (GLUCOPHAGE) 1000 MG tablet; Take 1 tablet (1,000 mg total) by mouth in the morning and at bedtime.  Dispense: 180 tablet; Refill: 3 - blood glucose meter kit and supplies KIT; Dispense based on patient and insurance preference. Use up to four times daily as directed. Please include lancets, test strips,  control solution.  Dispense: 1 each; Refill: 0  2. Essential hypertension Stable. Continue amlodipine 10 mg daily. Refill provided. - amLODipine (NORVASC) 10 MG tablet; Take 1 tablet (10 mg total) by mouth daily.  Dispense: 90 tablet; Refill: 3  3. Acute pain of right knee Continue gabapentin 300 mg twice daily, refill provided. - gabapentin (NEURONTIN) 300 MG capsule; Take 1 capsule (300 mg total) by mouth 2 (two) times daily.  Dispense: 180 capsule; Refill: 3  4. Lower extremity edema Continue furosemide 20 mg every other day and HCTZ 25 mg every other day as instructed. Refills provided. -  furosemide (LASIX) 20 MG tablet; Take 1 tablet (20 mg total) by mouth every other day. Opposite HCTZ  Dispense: 45 tablet; Refill: 3 - hydrochlorothiazide (HYDRODIURIL) 25 MG tablet; Take 1 tablet (25 mg total) by mouth every other day. Opposite furosemide  Dispense: 45 tablet; Refill: 3  5. Varicose veins of left lower extremity with pain Managed with furosemide, HCTZ, and gabapentin. - furosemide (LASIX) 20 MG tablet; Take 1 tablet (20 mg total) by mouth every other day. Opposite HCTZ  Dispense: 45 tablet; Refill: 3 - gabapentin (NEURONTIN) 300 MG capsule; Take 1 capsule (300 mg total) by mouth 2 (two) times daily.  Dispense: 180 capsule; Refill: 3 - hydrochlorothiazide (HYDRODIURIL) 25 MG tablet; Take 1 tablet (25 mg total) by mouth every other day. Opposite furosemide  Dispense: 45 tablet; Refill: 3  6. Pneumonia of both lungs due to infectious organism, unspecified part of lung Continue doxycycline twice daily until complete. Discussed importance of pushing fluids and doing deep breathing exercises. Avoiding cough suppressants at this time. May continue using as needed Tylenol for low-grade fever and discomfort. If worsening shortness of breath, high fevers, low blood pressures occur seek medical attention at the nearest urgent care or emergency room.  7. Sleeping difficulty Does not seem related  to coughing as patient reports he does not cough as much at night. More related to overall restlessness. Recommend trial of melatonin or other OTC sleep aid.  Discussed options available for help with medical care and expenses including charity care, medication, and leukocyte count [facility] office.  Outpatient Encounter Medications as of 11/08/2019  Medication Sig  . acetaminophen (TYLENOL) 500 MG tablet Take 500 mg by mouth every 6 (six) hours as needed.  Marland Kitchen amLODipine (NORVASC) 10 MG tablet Take 1 tablet (10 mg total) by mouth daily.  Marland Kitchen doxycycline (VIBRAMYCIN) 100 MG capsule Take 1 capsule (100 mg total) by mouth 2 (two) times daily. One po bid x 7 days  . furosemide (LASIX) 20 MG tablet Take 1 tablet (20 mg total) by mouth every other day. Opposite HCTZ  . gabapentin (NEURONTIN) 300 MG capsule Take 1 capsule (300 mg total) by mouth 2 (two) times daily.  Marland Kitchen glipiZIDE (GLUCOTROL) 5 MG tablet Take 1 tablet (5 mg total) by mouth in the morning and at bedtime.  . hydrochlorothiazide (HYDRODIURIL) 25 MG tablet Take 1 tablet (25 mg total) by mouth every other day. Opposite furosemide  . metFORMIN (GLUCOPHAGE) 1000 MG tablet Take 1 tablet (1,000 mg total) by mouth in the morning and at bedtime.  . [DISCONTINUED] amLODipine (NORVASC) 10 MG tablet Take 1 tablet (10 mg total) by mouth daily for 10 days.  . [DISCONTINUED] furosemide (LASIX) 20 MG tablet Take 20 mg by mouth every other day. Opposite HCTZ  . [DISCONTINUED] gabapentin (NEURONTIN) 300 MG capsule Take 300 mg by mouth 2 (two) times daily.   . [DISCONTINUED] glipiZIDE (GLUCOTROL) 5 MG tablet Take 1 tablet by mouth in the morning and at bedtime.  . [DISCONTINUED] hydrochlorothiazide (HYDRODIURIL) 25 MG tablet Take 25 mg by mouth every other day. Opposite furosemide  . [DISCONTINUED] metFORMIN (GLUCOPHAGE) 1000 MG tablet Take 1 tablet by mouth in the morning and at bedtime.  . blood glucose meter kit and supplies KIT Dispense based on patient and  insurance preference. Use up to four times daily as directed. Please include lancets, test strips, control solution.  . [DISCONTINUED] DICLOFENAC PO Take by mouth.  . [DISCONTINUED] UNKNOWN TO PATIENT    No facility-administered encounter medications on  file as of 11/08/2019.    Follow-up: Return in about 3 months (around 02/07/2020) for DM, HTN follow up.   Samuel Bouche, NP

## 2019-11-08 NOTE — Patient Instructions (Addendum)
Continue Doxycycline until finished  Try OTC sleep aids or melatonin.   Take deep breaths at least 10 every hour when awake.  Drink plenty of fluids.  Look up [facility] office for [facility] for medication cost help.

## 2019-11-22 ENCOUNTER — Telehealth: Payer: Self-pay | Admitting: Medical-Surgical

## 2019-11-22 NOTE — Telephone Encounter (Signed)
Ms. Frank Mcclain called. She said they were told to call if Mr. Frank Mcclain was having any issues: he is out of his pain med for his varicosed leg and pain has come back. Thank you.

## 2019-11-23 LAB — HEMOGLOBIN A1C: Hemoglobin A1C: 12.3

## 2019-11-23 NOTE — Telephone Encounter (Signed)
PTs wife called asking about aintibiotic medication to be refilled. Not pain medication. "Because Mohamadou is self pay Joy just told us to call in to refill the antibiotic medication"  Please advise.

## 2019-11-23 NOTE — Telephone Encounter (Signed)
Pain medication (gabapentin) was refilled on 11/08/2019 with 180 tablets so he should be good with that. There is no other pain medication on file for him.  As for the antibiotic, he completed a course of antibiotics for the pneumonia recently. Regular maintenance medications can be sent in without an appointment if appropriate but not antibiotics. I understand that he is a self-pay patient but if there are symptoms that require an antibiotic, then he will need to be evaluated.

## 2019-11-23 NOTE — Telephone Encounter (Signed)
Spoke with pts wife, Mrs. Sherral Hammers, and informed her of Joy's response. She said that it was not pain medication that she was asking about, it was the antibiotic. I informed her of what Joy said in regard to that and she was agreeable to scheduling an appt for him. Call transferred to the front desk for scheduling.

## 2019-11-24 ENCOUNTER — Ambulatory Visit (INDEPENDENT_AMBULATORY_CARE_PROVIDER_SITE_OTHER): Payer: Self-pay | Admitting: Medical-Surgical

## 2019-11-24 ENCOUNTER — Other Ambulatory Visit: Payer: Self-pay

## 2019-11-24 ENCOUNTER — Encounter: Payer: Self-pay | Admitting: Medical-Surgical

## 2019-11-24 VITALS — BP 109/75 | HR 87 | Temp 97.8°F | Ht 67.0 in | Wt 240.5 lb

## 2019-11-24 DIAGNOSIS — E1165 Type 2 diabetes mellitus with hyperglycemia: Secondary | ICD-10-CM | POA: Insufficient documentation

## 2019-11-24 DIAGNOSIS — E119 Type 2 diabetes mellitus without complications: Secondary | ICD-10-CM | POA: Insufficient documentation

## 2019-11-24 DIAGNOSIS — G479 Sleep disorder, unspecified: Secondary | ICD-10-CM | POA: Insufficient documentation

## 2019-11-24 DIAGNOSIS — J189 Pneumonia, unspecified organism: Secondary | ICD-10-CM | POA: Insufficient documentation

## 2019-11-24 DIAGNOSIS — R600 Localized edema: Secondary | ICD-10-CM | POA: Insufficient documentation

## 2019-11-24 DIAGNOSIS — I10 Essential (primary) hypertension: Secondary | ICD-10-CM | POA: Insufficient documentation

## 2019-11-24 DIAGNOSIS — I83812 Varicose veins of left lower extremities with pain: Secondary | ICD-10-CM | POA: Insufficient documentation

## 2019-11-24 MED ORDER — GABAPENTIN 300 MG PO CAPS: 600.0000 mg | ORAL_CAPSULE | Freq: Two times a day (BID) | ORAL | 3 refills | Status: DC

## 2019-11-24 MED ORDER — MELOXICAM 15 MG PO TABS: 15.0000 mg | ORAL_TABLET | Freq: Every day | ORAL | 0 refills | Status: DC

## 2019-11-24 NOTE — Progress Notes (Signed)
Subjective:    CC: Left lower extremity pain  HPI: Pleasant 59 year old Spanish-speaking male presenting today with continued left lower extremity pain.  History of severe varicose veins of the left lower extremity accompanied by pain and edema.  Recently treated for superficial thrombophlebitis with doxycycline x7 days.  Reports the pain in his leg resolved while on the antibiotic but has since returned.  Taking gabapentin 300 mg twice daily.  I reviewed the past medical history, family history, social history, surgical history, and allergies today and no changes were needed.  Please see the problem list section below in epic for further details.  Past Medical History: Past Medical History:  Diagnosis Date  . Diabetes mellitus without complication ([place])   . Hypertension   . Varicose veins of left leg with edema    Past Surgical History: Past Surgical History:  Procedure Laterality Date  . KNEE SURGERY    . VARICOSE VEIN SURGERY     Social History: Social History   Socioeconomic History  . Marital status: Married    Spouse name: Not on file  . Number of children: Not on file  . Years of education: Not on file  . Highest education level: Not on file  Occupational History  . Not on file  Tobacco Use  . Smoking status: Never Smoker  . Smokeless tobacco: Never Used  Substance and Sexual Activity  . Alcohol use: Yes    Alcohol/week: 21.0 - 42.0 standard drinks    Types: 21 - 42 Cans of beer per week    Comment: 3-6 beers/day  . Drug use: No  . Sexual activity: Yes  Other Topics Concern  . Not on file  Social History Narrative  . Not on file   Social Determinants of Health   Financial Resource Strain:   . Difficulty of Paying Living Expenses:   Food Insecurity:   . Worried About Charity fundraiser in the Last Year:   . Arboriculturist in the Last Year:   Transportation Needs:   . Film/video editor (Medical):   Marland Kitchen Lack of Transportation (Non-Medical):    Physical Activity:   . Days of Exercise per Week:   . Minutes of Exercise per Session:   Stress:   . Feeling of Stress :   Social Connections:   . Frequency of Communication with Friends and Family:   . Frequency of Social Gatherings with Friends and Family:   . Attends Religious Services:   . Active Member of Clubs or Organizations:   . Attends Archivist Meetings:   Marland Kitchen Marital Status:    Family History: Family History  Family history unknown: Yes   Allergies: No Known Allergies Medications: See med rec.  Review of Systems: No fevers, chills, night sweats, weight loss, chest pain, or shortness of breath.   Objective:    General: Well Developed, well nourished, and in no acute distress.  Neuro: Alert and oriented x3.  HEENT: Normocephalic, atraumatic.  Skin: Warm and dry. Cardiac: Regular rate and rhythm, no murmurs rubs or gallops, no lower extremity edema.  Respiratory: Clear to auscultation bilaterally. Not using accessory muscles, speaking in full sentences. Left lower extremity: No erythema, warmth, or alterations in skin.  Moderate nonpitting edema.  Multiple torturous varicose veins along the lower leg from knee to ankles.  Tenderness along the popliteal space.   Impression and Recommendations:    1. Varicose veins of left lower extremity with pain No acute indications of infection  or recurrent phlebitis at this time.  Suspect anti-inflammatory action of doxycycline helped with pain control.  Starting meloxicam 15 mg daily.  Advised to avoid other NSAIDs while taking this.  Also increase gabapentin to 600 mg twice daily.  Although they are self-pay, or strongly feel that he needs evaluation by vascular surgeon for these severe varicose veins.  Discussed referral with patient and wife and they are agreeable to proceed with this.  Referral entered. - Ambulatory referral to Vascular Surgery - gabapentin (NEURONTIN) 300 MG capsule; Take 2 capsules (600 mg total) by  mouth 2 (two) times daily.  Dispense: 180 capsule; Refill: 3   ___________________________________________ Thayer Ohm, DNP, APRN, FNP-BC Primary Care and Sports Medicine [facility]

## 2019-11-24 NOTE — Patient Instructions (Signed)
Venas varicosas Varicose Veins Las venas varicosas son venas que se han agrandado y abultado, y se han tornado sinuosas. Suelen aparecer en las piernas. Cules son las causas? La causa de esta afeccin es el dao en las vlvulas de la vena. Estas vlvulas ayudan a que la sangre regrese al corazn. Cuando estn daadas y dejan de funcionar correctamente, la sangre puede ir en direccin inversa y retornar a las venas cerca de la piel, lo que hace que las venas se agranden y se vean sinuosas. La afeccin puede surgir por cualquier factor que haga que la sangre retorne, como un embarazo, una actividad que obligue a estar de pie de forma prolongada o la obesidad. Qu incrementa el riesgo? Es ms probable que esta afeccin se manifieste en las personas con estas caractersticas:  Permanecen de pie mucho tiempo.  Estn embarazadas.  Tienen sobrepeso. Cules son los signos o los sntomas? Los sntomas de esta afeccin incluyen:  Venas abultadas, sinuosas y azuladas.  Sensacin de pesadez. Estos sntomas pueden empeorar hacia el final del da.  Dolor en las piernas. Estos sntomas pueden empeorar hacia el final del da.  Hinchazn en la pierna.  Cambios en el color de la piel que est sobre las venas. Cmo se diagnostica? Esta afeccin se puede diagnosticar en funcin de los sntomas, un examen fsico y una ecografa. Cmo se trata? El tratamiento de esta afeccin puede incluir lo siguiente:  Evitar estar sentado o de pie en la misma posicin durante mucho tiempo.  Usar medias de compresin. Estas medias ayudan a evitar la formacin de cogulos de sangre y a reducir la hinchazn de las piernas.  Levantar (elevar) las piernas al descansar.  Bajar de peso.  Hacer actividad fsica con regularidad. Si tiene sntomas persistentes o desea mejorar el aspecto de las venas varicosas, puede optar por un procedimiento para anular dichas venas o extraerlas. Entre los tratamientos para anular  las venas, se incluyen los siguientes:  Escleroterapia. En este tratamiento, se inyecta una solucin en la vena para anularla.  Tratamiento con lser. En este tratamiento, la vena se calienta con un lser para anularla.  Ablacin venosa por radiofrecuencia. En este tratamiento, se usa una corriente elctrica que se produce mediante ondas de radio para anular la vena. Entre los tratamientos para extraer las venas, se incluyen los siguientes:  Flebectoma. En este tratamiento, las venas se extraen a travs de pequeas incisiones que se realizan por encima de las venas.  Ligadura venosa y varicectoma. En este tratamiento, se realizan incisiones por encima de las venas. Luego, las venas se extraen despus de atarse (ligarse) con puntos (suturas). Siga estas instrucciones en su casa: Actividad  Camine todo lo que pueda. Caminar aumenta el flujo sanguneo. Esto ayuda a que la sangre regrese al corazn y alivia la presin en las venas. Tambin aumenta la fuerza cardiovascular.  Siga las instrucciones del mdico con respecto al ejercicio.  No permanezca de pie o sentado en una misma posicin durante mucho tiempo.  No se siente con las piernas cruzadas.  Descanse con las piernas levantadas durante el da. Instrucciones generales   Siga las instrucciones del mdico en lo que respecta a la dieta.  Use medias de compresin como se lo haya indicado su mdico. No use otra clase de vestimenta ajustada alrededor de las piernas, la pelvis o la cintura.  De noche, eleve las piernas a una altura superior al nivel del corazn.  Si se corta en la piel que est por encima de   una vena varicosa y esta sangra: ? Recustese con la pierna levantada. ? Coloque un pao limpio sobre el corte y presione firmemente sobre l hasta que el sangrado se detenga. ? Aplique una venda (vendaje) sobre el corte. Comunquese con un mdico si:  La piel alrededor de las venas varicosas empieza a agrietarse.  Siente  dolor o tiene enrojecimiento, sensibilidad o hinchazn dura sobre la vena.  Est incmodo debido al dolor.  Se corta en la piel de encima de una vena varicosa y no deja de sangrar. Resumen  Las venas varicosas son venas que se han agrandado y abultado, y se han tornado sinuosas. Suelen aparecer en las piernas.  La causa de esta afeccin es el dao en las vlvulas de la vena. Estas vlvulas ayudan a que la sangre regrese al corazn.  El tratamiento de esta afeccin incluye hacer movimientos frecuentes, usar medias de compresin, perder peso y hacer ejercicios de forma regular. En algunos casos, se realizan procedimientos que anulan o extraen las venas.  El tratamiento de esta afeccin puede incluir usar medias de compresin, elevar las piernas, perder peso y realizar actividades de forma regular. En algunos casos, se realizan procedimientos que anulan o extraen las venas. Esta informacin no tiene como fin reemplazar el consejo del mdico. Asegrese de hacerle al mdico cualquier pregunta que tenga. Document Revised: 10/13/2018 Document Reviewed: 10/13/2018 Elsevier Patient Education  2020 [employer]  

## 2019-12-23 ENCOUNTER — Encounter: Payer: Self-pay | Admitting: Medical-Surgical

## 2019-12-29 ENCOUNTER — Other Ambulatory Visit: Payer: Self-pay | Admitting: Medical-Surgical

## 2019-12-29 DIAGNOSIS — I83812 Varicose veins of left lower extremities with pain: Secondary | ICD-10-CM

## 2020-01-03 ENCOUNTER — Other Ambulatory Visit: Payer: Self-pay | Admitting: *Deleted

## 2020-01-03 DIAGNOSIS — I83893 Varicose veins of bilateral lower extremities with other complications: Secondary | ICD-10-CM

## 2020-01-05 ENCOUNTER — Encounter: Payer: Self-pay | Admitting: Vascular Surgery

## 2020-01-05 ENCOUNTER — Ambulatory Visit (HOSPITAL_COMMUNITY)
Admission: RE | Admit: 2020-01-05 | Discharge: 2020-01-05 | Disposition: A | Discharge status: Home / Self Care | Payer: Self-pay | Source: Ambulatory Visit | Attending: Surgery | Admitting: Surgery

## 2020-01-05 ENCOUNTER — Other Ambulatory Visit: Payer: Self-pay

## 2020-01-05 ENCOUNTER — Ambulatory Visit (INDEPENDENT_AMBULATORY_CARE_PROVIDER_SITE_OTHER): Payer: Self-pay | Admitting: Vascular Surgery

## 2020-01-05 VITALS — BP 113/71 | HR 71 | Temp 97.9°F | Resp 18 | Ht 69.0 in | Wt 244.0 lb

## 2020-01-05 DIAGNOSIS — I83893 Varicose veins of bilateral lower extremities with other complications: Secondary | ICD-10-CM | POA: Insufficient documentation

## 2020-01-05 DIAGNOSIS — I872 Venous insufficiency (chronic) (peripheral): Secondary | ICD-10-CM

## 2020-01-05 DIAGNOSIS — I83812 Varicose veins of left lower extremities with pain: Secondary | ICD-10-CM

## 2020-01-05 NOTE — Progress Notes (Signed)
REASON FOR CONSULT:    Painful varicose veins of the left lower extremity.  The consult is requested by Samuel Bouche, NP.  ASSESSMENT & PLAN:   PAINFUL VARICOSE VEINS LEFT LOWER EXTREMITY AND CHRONIC VENOUS INSUFFICIENCY: This patient has significant deep venous reflux on the left and also superficial venous reflux involving the small saphenous vein which has a thigh extension (vein of Giacomini) extending all the way up into the proximal thigh.  There is reflux throughout.  There is no connection to the popliteal vein that I can identify.  The patient has had a long history of leg swelling for as long as he can remember.  This is always been on the left side.  We have discussed the importance of intermittent leg elevation the proper positioning for this.  In addition I have recommended thigh-high compression stockings with a gradient of 20 to 30 mmHg and we will get him fitted for the stockings.  I have encouraged him to avoid prolonged sitting and standing.  We have discussed the importance of exercise specifically walking and water aerobics.  We also discussed the importance of maintaining a healthy weight.  I will plan on seeing him back in 3 months.  If at that time his symptoms have not improved significantly then I think he would be a candidate for endovenous laser ablation of the small saphenous vein and vein of Giacomini with greater than 20 stab phlebectomies.  Ideally I would want to consider a CT venogram to evaluate his outflow given the long history of left lower extremity swelling however the patient and his wife are concerned about the cost of this more extensive work-up and I certainly understand that.  For this reason we will hold off on a CT scan at this point.  I will plan on seeing him back in 3 months.  He knows to call sooner if he has problems.   Deitra Mayo, MD Office: [phone]   HPI:   Frank Mcclain is a pleasant 59 y.o. male, who has had left leg swelling for as  long as he can remember.  He denies any previous history of DVT.  He did have his great saphenous vein in the left leg stripped many years ago in [place].  He has not been wearing compression stockings.  He does elevate his legs which do help his symptoms.  He experiences significant aching and heaviness in his legs in addition to swelling and itching in the left leg.  His symptoms are worse at the end of the day.  His symptoms are relieved with elevation.  He denies any history of claudication, rest pain, or nonhealing ulcers.  His risk factors for peripheral vascular disease include diabetes and hypertension.  He denies any history of hypercholesterolemia, family history of premature cardiovascular disease, or smoking history.  Past Medical History:  Diagnosis Date   Diabetes mellitus without complication ([place])    Hypertension    Varicose veins of left leg with edema     Family History  Family history unknown: Yes    SOCIAL HISTORY: Social History   Socioeconomic History   Marital status: Married    Spouse name: Not on file   Number of children: Not on file   Years of education: Not on file   Highest education level: Not on file  Occupational History   Not on file  Tobacco Use   Smoking status: Never Smoker   Smokeless tobacco: Never Used  Substance and Sexual Activity  Alcohol use: Yes    Alcohol/week: 21.0 - 42.0 standard drinks    Types: 21 - 42 Cans of beer per week    Comment: 3-6 beers/day   Drug use: No   Sexual activity: Yes  Other Topics Concern   Not on file  Social History Narrative   Not on file   Social Determinants of Health   Financial Resource Strain:    Difficulty of Paying Living Expenses:   Food Insecurity:    Worried About Charity fundraiser in the Last Year:    Arboriculturist in the Last Year:   Transportation Needs:    Film/video editor (Medical):    Lack of Transportation (Non-Medical):   Physical Activity:      Days of Exercise per Week:    Minutes of Exercise per Session:   Stress:    Feeling of Stress :   Social Connections:    Frequency of Communication with Friends and Family:    Frequency of Social Gatherings with Friends and Family:    Attends Religious Services:    Active Member of Clubs or Organizations:    Attends Music therapist:    Marital Status:   Intimate Partner Violence:    Fear of Current or Ex-Partner:    Emotionally Abused:    Physically Abused:    Sexually Abused:     No Known Allergies  Current Outpatient Medications  Medication Sig Dispense Refill   acetaminophen (TYLENOL) 500 MG tablet Take 500 mg by mouth every 6 (six) hours as needed.     amLODipine (NORVASC) 10 MG tablet Take 1 tablet (10 mg total) by mouth daily. 90 tablet 3   blood glucose meter kit and supplies KIT Dispense based on patient and insurance preference. Use up to four times daily as directed. Please include lancets, test strips, control solution. 1 each 0   furosemide (LASIX) 20 MG tablet Take 1 tablet (20 mg total) by mouth every other day. Opposite HCTZ 45 tablet 3   gabapentin (NEURONTIN) 300 MG capsule Take 2 capsules (600 mg total) by mouth 2 (two) times daily. 180 capsule 3   glipiZIDE (GLUCOTROL) 5 MG tablet Take 1 tablet (5 mg total) by mouth in the morning and at bedtime. 180 tablet 3   hydrochlorothiazide (HYDRODIURIL) 25 MG tablet Take 1 tablet (25 mg total) by mouth every other day. Opposite furosemide 45 tablet 3   meloxicam (MOBIC) 15 MG tablet Take 1 tablet by mouth once daily 30 tablet 0   metFORMIN (GLUCOPHAGE) 1000 MG tablet Take 1 tablet (1,000 mg total) by mouth in the morning and at bedtime. 180 tablet 3   No current facility-administered medications for this visit.    REVIEW OF SYSTEMS:  [X]  denotes positive finding, [ ]  denotes negative finding Cardiac  Comments:  Chest pain or chest pressure:    Shortness of breath upon exertion:     Short of breath when lying flat:    Irregular heart rhythm:        Vascular    Pain in calf, thigh, or hip brought on by ambulation:    Pain in feet at night that wakes you up from your sleep:     Blood clot in your veins: x   Leg swelling:  x       Pulmonary    Oxygen at home:    Productive cough:     Wheezing:         Neurologic  Sudden weakness in arms or legs:     Sudden numbness in arms or legs:     Sudden onset of difficulty speaking or slurred speech:    Temporary loss of vision in one eye:     Problems with dizziness:         Gastrointestinal    Blood in stool:     Vomited blood:         Genitourinary    Burning when urinating:     Blood in urine:        Psychiatric    Major depression:         Hematologic    Bleeding problems:    Problems with blood clotting too easily:        Skin    Rashes or ulcers:        Constitutional    Fever or chills:     PHYSICAL EXAM:   Vitals:   01/05/20 1325  BP: 113/71  Pulse: 71  Resp: 18  Temp: 97.9 F (36.6 C)  TempSrc: Temporal  SpO2: 96%  Weight: 244 lb (110.7 kg)  Height: 5' 9"  (1.753 m)    GENERAL: The patient is a well-nourished male, in no acute distress. The vital signs are documented above. CARDIAC: There is a regular rate and rhythm.  VASCULAR: I do not detect carotid bruits. He has palpable dorsalis pedis pulses bilaterally. He has some large varicose veins along the lateral aspect of his left leg and posterior calf which are under significantly elevated pressure.  He also has reticular veins and spider veins. I looked at the small saphenous vein myself with the SonoSite and he has a vein of Giacomini extending all the way up to the medial proximal thigh.  There is significant reflux throughout.  These veins are under significantly elevated pressure.     RIGHT LEG calf 41 cm, ankle 24 cm. LEFT LEG: Calf 51 cm, ankle 31 cm. PULMONARY: There is good air exchange bilaterally without wheezing or  rales. ABDOMEN: Soft and non-tender with normal pitched bowel sounds.  MUSCULOSKELETAL: There are no major deformities or cyanosis. NEUROLOGIC: No focal weakness or paresthesias are detected. SKIN: There are no ulcers or rashes noted. PSYCHIATRIC: The patient has a normal affect.  DATA:    VENOUS DUPLEX: I have independently interpreted his venous duplex scan of the left lower extremity.  There is no evidence of DVT.  There is deep venous reflux involving the common femoral vein, femoral vein, and popliteal vein.  There is superficial venous reflux involving the small saphenous vein which is dilated with diameters ranging from 0.45-0.5 cm.  The patient has had previous laser ablation of the left great saphenous vein.

## 2020-01-19 DIAGNOSIS — I8393 Asymptomatic varicose veins of bilateral lower extremities: Secondary | ICD-10-CM

## 2020-01-29 ENCOUNTER — Other Ambulatory Visit: Payer: Self-pay | Admitting: Medical-Surgical

## 2020-01-29 DIAGNOSIS — I83812 Varicose veins of left lower extremities with pain: Secondary | ICD-10-CM

## 2020-02-27 ENCOUNTER — Other Ambulatory Visit: Payer: Self-pay | Admitting: Medical-Surgical

## 2020-02-27 DIAGNOSIS — I83812 Varicose veins of left lower extremities with pain: Secondary | ICD-10-CM

## 2020-04-05 ENCOUNTER — Other Ambulatory Visit: Payer: Self-pay | Admitting: Medical-Surgical

## 2020-04-05 DIAGNOSIS — I83812 Varicose veins of left lower extremities with pain: Secondary | ICD-10-CM

## 2020-07-12 ENCOUNTER — Other Ambulatory Visit: Payer: Self-pay | Admitting: Medical-Surgical

## 2020-07-12 DIAGNOSIS — I83812 Varicose veins of left lower extremities with pain: Secondary | ICD-10-CM

## 2020-07-12 DIAGNOSIS — R600 Localized edema: Secondary | ICD-10-CM

## 2020-09-05 ENCOUNTER — Other Ambulatory Visit: Payer: Self-pay | Admitting: Medical-Surgical

## 2020-09-05 DIAGNOSIS — I83812 Varicose veins of left lower extremities with pain: Secondary | ICD-10-CM

## 2020-09-23 ENCOUNTER — Other Ambulatory Visit: Payer: Self-pay | Admitting: Medical-Surgical

## 2020-09-23 DIAGNOSIS — I83812 Varicose veins of left lower extremities with pain: Secondary | ICD-10-CM

## 2020-12-01 ENCOUNTER — Other Ambulatory Visit: Payer: Self-pay

## 2020-12-01 ENCOUNTER — Encounter: Payer: Self-pay | Admitting: Emergency Medicine

## 2020-12-01 ENCOUNTER — Emergency Department
Admission: EM | Admit: 2020-12-01 | Discharge: 2020-12-01 | Disposition: A | Discharge status: Home / Self Care | Payer: Self-pay | Source: Home / Self Care

## 2020-12-01 ENCOUNTER — Emergency Department (INDEPENDENT_AMBULATORY_CARE_PROVIDER_SITE_OTHER): Payer: Self-pay

## 2020-12-01 DIAGNOSIS — R0602 Shortness of breath: Secondary | ICD-10-CM

## 2020-12-01 DIAGNOSIS — R059 Cough, unspecified: Secondary | ICD-10-CM

## 2020-12-01 DIAGNOSIS — M940 Chondrocostal junction syndrome [Tietze]: Secondary | ICD-10-CM

## 2020-12-01 DIAGNOSIS — J209 Acute bronchitis, unspecified: Secondary | ICD-10-CM

## 2020-12-01 MED ORDER — METHYLPREDNISOLONE SODIUM SUCC 125 MG IJ SOLR
125.0000 mg | Freq: Once | INTRAMUSCULAR | Status: AC
  Administered 2020-12-01: 125 mg via INTRAMUSCULAR

## 2020-12-01 MED ORDER — ALBUTEROL SULFATE HFA 108 (90 BASE) MCG/ACT IN AERS
2.0000 | INHALATION_SPRAY | Freq: Once | RESPIRATORY_TRACT | Status: AC
  Administered 2020-12-01: 2 via RESPIRATORY_TRACT

## 2020-12-01 MED ORDER — PREDNISONE 20 MG PO TABS: 40.0000 mg | ORAL_TABLET | Freq: Every day | ORAL | 0 refills | Status: AC

## 2020-12-01 MED ORDER — GUAIFENESIN-CODEINE 100-10 MG/5ML PO SYRP: 5.0000 mL | ORAL_SOLUTION | Freq: Three times a day (TID) | ORAL | 0 refills | Status: DC | PRN

## 2020-12-01 MED ORDER — BENZONATATE 100 MG PO CAPS: 100.0000 mg | ORAL_CAPSULE | Freq: Three times a day (TID) | ORAL | 0 refills | Status: DC

## 2020-12-01 NOTE — Discharge Instructions (Addendum)
Your x-ray was negative today for pneumonia.  I have sent in prednisone for you to take 2 tablets daily in the morning for 5 days.  This medication can cause her blood sugar to increase, so keep a close eye on your blood sugars at home.  I have sent in tessalon perles for you to use one capsule every 8 hours as needed for cough.  I have sent in cough syrup for you to take. This medication can make you sleepy. Do not drive while taking this medication.  You received a steroid injection in the office today to help open you up.  You have also received an albuterol inhaler in the office today.  You may use this every 4 hours as needed for cough, shortness of breath, wheezing.  Follow up with this office or with primary care if symptoms are persisting.  Follow up in the ER for high fever, trouble swallowing, trouble breathing, other concerning symptoms.

## 2020-12-01 NOTE — ED Triage Notes (Signed)
Cough x 2.5 weeks  OTC Nyquil  Denies fever  No COVID booster

## 2020-12-01 NOTE — ED Provider Notes (Signed)
Frank Mcclain   [id number] 12/01/20 Arrival Time: 0908   CC: COVID symptoms  SUBJECTIVE: History from: patient and family.  Frank Mcclain is a 60 y.o. male who presents with cough for the last 2-1/2 weeks.  Has been using NyQuil over-the-counter at night.  Has negative history of COVID.  Has had COVID vaccines, no booster.  No known COVID or flu exposure.  Has not completed flu vaccine this year. Denies recent travel.  States that the cough is worse at night and with activity.  Denies previous symptoms in the past. Denies fever, chills, fatigue, sinus pain, rhinorrhea, sore throat, SOB, wheezing, chest pain, nausea, changes in bowel or bladder habits.    ROS: As per HPI.  All other pertinent ROS negative.     Past Medical History:  Diagnosis Date  . Diabetes mellitus without complication ([place])   . Hypertension   . Varicose veins of left leg with edema    Past Surgical History:  Procedure Laterality Date  . KNEE SURGERY    . VARICOSE VEIN SURGERY     No Known Allergies No current facility-administered medications on file prior to encounter.   Current Outpatient Medications on File Prior to Encounter  Medication Sig Dispense Refill  . acetaminophen (TYLENOL) 500 MG tablet Take 500 mg by mouth every 6 (six) hours as needed.    Marland Kitchen amLODipine (NORVASC) 10 MG tablet Take 1 tablet (10 mg total) by mouth daily. 90 tablet 3  . blood glucose meter kit and supplies KIT Dispense based on patient and insurance preference. Use up to four times daily as directed. Please include lancets, test strips, control solution. 1 each 0  . furosemide (LASIX) 20 MG tablet TAKE 1 TABLET BY MOUTH EVERY OTHER DAY, OPPOSITE OF HCTZ  **PATIENT NEEDS OFFICE VISIT FOR ADDITIONAL REFILLS** 15 tablet 0  . gabapentin (NEURONTIN) 300 MG capsule Take 2 capsules (600 mg total) by mouth 2 (two) times daily. 180 capsule 3  . glipiZIDE (GLUCOTROL) 5 MG tablet Take 1 tablet (5 mg total) by mouth in the morning  and at bedtime. 180 tablet 3  . hydrochlorothiazide (HYDRODIURIL) 25 MG tablet Take 1 tablet (25 mg total) by mouth every other day. Opposite furosemide 45 tablet 3  . meloxicam (MOBIC) 15 MG tablet TAKE 1 TABLET BY MOUTH ONCE DAILY . APPOINTMENT REQUIRED FOR FUTURE REFILLS 15 tablet 0   Social History   Socioeconomic History  . Marital status: Married    Spouse name: Not on file  . Number of children: Not on file  . Years of education: Not on file  . Highest education level: Not on file  Occupational History  . Not on file  Tobacco Use  . Smoking status: Never Smoker  . Smokeless tobacco: Never Used  Vaping Use  . Vaping Use: Never used  Substance and Sexual Activity  . Alcohol use: Yes    Alcohol/week: 21.0 - 42.0 standard drinks    Types: 21 - 42 Cans of beer per week    Comment: 3-6 beers/day  . Drug use: No  . Sexual activity: Yes  Other Topics Concern  . Not on file  Social History Narrative  . Not on file   Social Determinants of Health   Financial Resource Strain: Not on file  Food Insecurity: Not on file  Transportation Needs: Not on file  Physical Activity: Not on file  Stress: Not on file  Social Connections: Not on file  Intimate Partner Violence: Not on file  Family History  Family history unknown: Yes    OBJECTIVE:  Vitals:   12/01/20 0923 12/01/20 0926  BP:  131/86  Pulse:  76  Resp:  17  Temp:  98.8 F (37.1 C)  TempSrc:  Oral  SpO2:  96%  Weight: 260 lb (117.9 kg)   Height: 5' 10"  (1.778 m)      General appearance: alert; appears fatigued, but nontoxic; speaking in full sentences and tolerating own secretions HEENT: NCAT; Ears: EACs clear, TMs pearly gray; Eyes: PERRL.  EOM grossly intact. Sinuses: nontender; Nose: nares patent with clear rhinorrhea, Throat: oropharynx erythematous, cobblestoning present, tonsils non erythematous or enlarged, uvula midline  Neck: supple without LAD Lungs: unlabored respirations, symmetrical air entry;  cough: moderate; no respiratory distress; wheezing noted to the lateral lung fields, diminished sounds to bilateral lower lobes Heart: regular rate and rhythm.  Radial pulses 2+ symmetrical bilaterally Skin: warm and dry Psychological: alert and cooperative; normal mood and affect  LABS:  No results found for this or any previous visit (from the past 24 hour(s)).   ASSESSMENT & PLAN:  1. Acute bronchitis, unspecified organism   2. Costochondritis   3. SOB (shortness of breath)     Meds ordered this encounter  Medications  . predniSONE (DELTASONE) 20 MG tablet    Sig: Take 2 tablets (40 mg total) by mouth daily with breakfast for 5 days.    Dispense:  10 tablet    Refill:  0    Order Specific Question:   Supervising Provider    Answer:   Chase Picket [id number]  . benzonatate (TESSALON) 100 MG capsule    Sig: Take 1 capsule (100 mg total) by mouth every 8 (eight) hours.    Dispense:  21 capsule    Refill:  0    Order Specific Question:   Supervising Provider    Answer:   Chase Picket [id number]  . guaiFENesin-codeine (ROBITUSSIN AC) 100-10 MG/5ML syrup    Sig: Take 5 mLs by mouth 3 (three) times daily as needed for cough.    Dispense:  120 mL    Refill:  0    Order Specific Question:   Supervising Provider    Answer:   Chase Picket [id number]  . methylPREDNISolone sodium succinate (SOLU-MEDROL) 125 mg/2 mL injection 125 mg  . albuterol (VENTOLIN HFA) 108 (90 Base) MCG/ACT inhaler 2 puff   Chest x-ray in office today negative for pneumonia Albuterol inhaler given in office today Solu-Medrol 125 mg IM given in office today Prescribed steroid course Prescribed benzonatate as needed during the day Prescribed Cheratussin as needed at night Continue supportive care at home Get plenty of rest and push fluids Use OTC zyrtec for nasal congestion, runny nose, and/or sore throat Use OTC flonase for nasal congestion and runny nose Use medications daily for symptom  relief Use OTC medications like ibuprofen or tylenol as needed fever or pain Call or go to the ED if you have any new or worsening symptoms such as fever, worsening cough, shortness of breath, chest tightness, chest pain, turning blue, changes in mental status.  Reviewed expectations re: course of current medical issues. Questions answered. Outlined signs and symptoms indicating need for more acute intervention. Patient verbalized understanding. After Visit Summary given.         Faustino Congress, NP 12/04/20 1644

## 2020-12-03 ENCOUNTER — Telehealth: Payer: Self-pay | Admitting: Medical-Surgical

## 2020-12-03 ENCOUNTER — Other Ambulatory Visit: Payer: Self-pay

## 2020-12-03 DIAGNOSIS — E1165 Type 2 diabetes mellitus with hyperglycemia: Secondary | ICD-10-CM

## 2020-12-03 MED ORDER — METFORMIN HCL 1000 MG PO TABS: 1000.0000 mg | ORAL_TABLET | Freq: Two times a day (BID) | ORAL | 0 refills | Status: DC

## 2020-12-03 NOTE — Telephone Encounter (Signed)
Spouse called. Mr Frank Mcclain needs a refill on his meds except Metformin.  Appt scheduled for May 20th.  Thank you

## 2020-12-03 NOTE — Telephone Encounter (Signed)
Rx was written 11/08/2019 to take twice daily and given #180 tablets with no refills. According to his chart, we have not filled this for him since then. LVM for wife TCB to find out if he has been compliant with regimen, and if so, how has he been getting refills.   Rx refill sent to pharmacy to bridge him until he comes in for his appt on 12/21/20

## 2020-12-04 NOTE — Telephone Encounter (Signed)
(  2nd attempt) LVM for wife TCB to find out if he has been compliant with regimen, and if so, how has he been getting refills. Rx was written 11/08/2019 to take twice daily and given #180 tablets with no refills. According to his chart, we have not filled this for him since then.   Rx refill sent to pharmacy to bridge him until he comes in for his appt on 12/21/20

## 2020-12-05 NOTE — Telephone Encounter (Signed)
LVMTRC (3rd attempt). Multiple attempts to contact patient and his wife, all unsuccessful. Unable to contact patient.  Need to find out if he has been compliant with regimen, and if so, how has he been getting refills. Rx was written 11/08/2019 to take twice daily and given #180 tablets with no refills. According to his chart, we have not filled this for him since then.   Rx refill sent to pharmacy to bridge him until he comes in for his appt on 12/21/20  .

## 2020-12-06 ENCOUNTER — Other Ambulatory Visit: Payer: Self-pay | Admitting: Medical-Surgical

## 2020-12-06 DIAGNOSIS — I10 Essential (primary) hypertension: Secondary | ICD-10-CM

## 2020-12-06 DIAGNOSIS — I83812 Varicose veins of left lower extremities with pain: Secondary | ICD-10-CM

## 2020-12-21 ENCOUNTER — Encounter: Payer: Self-pay | Admitting: Medical-Surgical

## 2020-12-21 ENCOUNTER — Other Ambulatory Visit: Payer: Self-pay

## 2020-12-21 ENCOUNTER — Ambulatory Visit (INDEPENDENT_AMBULATORY_CARE_PROVIDER_SITE_OTHER): Payer: Self-pay | Admitting: Medical-Surgical

## 2020-12-21 VITALS — BP 106/67 | HR 79 | Ht 70.0 in | Wt 272.0 lb

## 2020-12-21 DIAGNOSIS — Z114 Encounter for screening for human immunodeficiency virus [HIV]: Secondary | ICD-10-CM

## 2020-12-21 DIAGNOSIS — E1165 Type 2 diabetes mellitus with hyperglycemia: Secondary | ICD-10-CM

## 2020-12-21 DIAGNOSIS — Z1159 Encounter for screening for other viral diseases: Secondary | ICD-10-CM

## 2020-12-21 DIAGNOSIS — I10 Essential (primary) hypertension: Secondary | ICD-10-CM

## 2020-12-21 DIAGNOSIS — R600 Localized edema: Secondary | ICD-10-CM

## 2020-12-21 DIAGNOSIS — E118 Type 2 diabetes mellitus with unspecified complications: Secondary | ICD-10-CM

## 2020-12-21 DIAGNOSIS — I83812 Varicose veins of left lower extremities with pain: Secondary | ICD-10-CM

## 2020-12-21 DIAGNOSIS — Z23 Encounter for immunization: Secondary | ICD-10-CM

## 2020-12-21 DIAGNOSIS — Z6839 Body mass index (BMI) 39.0-39.9, adult: Secondary | ICD-10-CM

## 2020-12-21 DIAGNOSIS — Z1211 Encounter for screening for malignant neoplasm of colon: Secondary | ICD-10-CM

## 2020-12-21 LAB — POCT UA - MICROALBUMIN
Albumin/Creatinine Ratio, Urine, POC: <30
Creatinine, POC: 200 mg/dL
Microalbumin Ur, POC: 30 mg/L

## 2020-12-21 LAB — POCT GLYCOSYLATED HEMOGLOBIN (HGB A1C): Hemoglobin A1C: 6.3 % — AB (ref 4.0–5.6)

## 2020-12-21 MED ORDER — AMLODIPINE BESYLATE 10 MG PO TABS: 1.0000 | ORAL_TABLET | Freq: Every day | ORAL | 3 refills | Status: DC

## 2020-12-21 MED ORDER — FUROSEMIDE 20 MG PO TABS: ORAL_TABLET | ORAL | 3 refills | Status: DC

## 2020-12-21 MED ORDER — MELOXICAM 15 MG PO TABS: ORAL_TABLET | ORAL | 3 refills | Status: DC

## 2020-12-21 MED ORDER — GLIPIZIDE 5 MG PO TABS: 5.0000 mg | ORAL_TABLET | Freq: Every day | ORAL | 3 refills | Status: DC

## 2020-12-21 MED ORDER — METFORMIN HCL 1000 MG PO TABS: 1000.0000 mg | ORAL_TABLET | Freq: Every day | ORAL | 3 refills | Status: DC

## 2020-12-21 MED ORDER — HYDROCHLOROTHIAZIDE 25 MG PO TABS: 25.0000 mg | ORAL_TABLET | ORAL | 3 refills | Status: DC

## 2020-12-21 NOTE — Progress Notes (Signed)
Subjective:    CC: Chronic disease follow-up  HPI: Pleasant 60 year old male accompanied by his wife who serves as Nurse, learning disability presenting for chronic disease follow-up.  Diabetes-wife notes that they reduced his glipizide and metformin dosing to once daily instead of twice daily due to some hypoglycemic episodes.  Not checking his glucose regularly but they do check it if there seems to be a problem or if he is not feeling well.  Hypertension-taking amlodipine, HCTZ, and furosemide as prescribed, tolerating well.  They do occasionally check his blood pressure at home with similar readings to today.  Lower extremity edema/varicose veins-taking HCTZ and furosemide as prescribed.  They did get in with vascular who was not worried and advised them just to continue with compression stockings and let them know if they have any problems or new symptoms arise.  I reviewed the past medical history, family history, social history, surgical history, and allergies today and no changes were needed.  Please see the problem list section below in epic for further details.  Past Medical History: Past Medical History:  Diagnosis Date  . Diabetes mellitus without complication (HCC)   . Hypertension   . Varicose veins of left leg with edema    Past Surgical History: Past Surgical History:  Procedure Laterality Date  . KNEE SURGERY    . VARICOSE VEIN SURGERY     Social History: Social History   Socioeconomic History  . Marital status: Married    Spouse name: Not on file  . Number of children: Not on file  . Years of education: Not on file  . Highest education level: Not on file  Occupational History  . Not on file  Tobacco Use  . Smoking status: Never Smoker  . Smokeless tobacco: Never Used  Vaping Use  . Vaping Use: Never used  Substance and Sexual Activity  . Alcohol use: Yes    Alcohol/week: 21.0 - 42.0 standard drinks    Types: 21 - 42 Cans of beer per week    Comment: 3-6 beers/day   . Drug use: No  . Sexual activity: Yes  Other Topics Concern  . Not on file  Social History Narrative  . Not on file   Social Determinants of Health   Financial Resource Strain: Not on file  Food Insecurity: Not on file  Transportation Needs: Not on file  Physical Activity: Not on file  Stress: Not on file  Social Connections: Not on file   Family History: Family History  Family history unknown: Yes   Allergies: No Known Allergies Medications: See med rec.  Review of Systems: See HPI for pertinent positives and negatives.   Objective:    General: Well Developed, well nourished, and in no acute distress.  Neuro: Alert and oriented x3.  HEENT: Normocephalic, atraumatic.  Skin: Warm and dry. Cardiac: Regular rate and rhythm, no murmurs rubs or gallops, no lower extremity edema.  Respiratory: Clear to auscultation bilaterally. Not using accessory muscles, speaking in full sentences.   Impression and Recommendations:    1. Essential hypertension Blood pressure looks great today at 106/67.  Continue amlodipine 10 mg daily and hydrochlorothiazide 25 mg every other day.  Continue furosemide 20 mg every other day, alternating with hydrochlorothiazide. - CBC with Differential/Platelet - COMPLETE METABOLIC PANEL WITH GFR - amLODipine (NORVASC) 10 MG tablet; Take 1 tablet (10 mg total) by mouth daily.  Dispense: 90 tablet; Refill: 3  2. Lower extremity edema Continue HCTZ and furosemide as ordered. - hydrochlorothiazide (HYDRODIURIL) 25  MG tablet; Take 1 tablet (25 mg total) by mouth every other day. Opposite furosemide  Dispense: 45 tablet; Refill: 3 - furosemide (LASIX) 20 MG tablet; TAKE 1 TABLET BY MOUTH EVERY OTHER DAY, OPPOSITE OF HCTZ  Dispense: 45 tablet; Refill: 3  3. Varicose veins of left lower extremity with pain Continue diuretics as ordered.  Continue meloxicam 15 mg daily as needed. - hydrochlorothiazide (HYDRODIURIL) 25 MG tablet; Take 1 tablet (25 mg total)  by mouth every other day. Opposite furosemide  Dispense: 45 tablet; Refill: 3 - meloxicam (MOBIC) 15 MG tablet; TAKE 1 TABLET BY MOUTH ONCE DAILY.  Dispense: 90 tablet; Refill: 3 - furosemide (LASIX) 20 MG tablet; TAKE 1 TABLET BY MOUTH EVERY OTHER DAY, OPPOSITE OF HCTZ  Dispense: 45 tablet; Refill: 3  4. Need for hepatitis C screening test Declined today due to cost concerns.  5. Screening for HIV (human immunodeficiency virus) Declined today due to cost concerns.  6. Need for pneumococcal vaccination Declined today due to cost concerns.  7. Screen for colon cancer Ordering Cologuard. - Cologuard  8. BMI 39.0-39.9,adult Checking TSH.  Recommend weight loss and regular exercise. - TSH  9. Controlled type 2 diabetes mellitus with complication, without long-term current use of insulin (HCC) A1c 6.3% today.  Since he had already reduced his dosing due to hypoglycemia, plan to continue glipizide 5 mg daily and metformin 1000 mg daily.  Point-of-care microalbumin completed.  Checking labs. - glipiZIDE (GLUCOTROL) 5 MG tablet; Take 1 tablet (5 mg total) by mouth daily before breakfast.  Dispense: 90 tablet; Refill: 3 - CBC with Differential/Platelet - COMPLETE METABOLIC PANEL WITH GFR - Lipid panel - POCT glycosylated hemoglobin (Hb A1C) - POCT UA - Microalbumin - metFORMIN (GLUCOPHAGE) 1000 MG tablet; Take 1 tablet (1,000 mg total) by mouth daily with breakfast.  Dispense: 90 tablet; Refill: 3  Return in about 6 months (around 06/23/2021) for chronic disease follow up. ___________________________________________ Thayer Ohm, DNP, APRN, FNP-BC Primary Care and Sports Medicine [facility] [place]

## 2020-12-22 LAB — COMPLETE METABOLIC PANEL WITH GFR
AG Ratio: 1.3 (calc) (ref 1.0–2.5)
ALT: 76 U/L — ABNORMAL HIGH (ref 9–46)
AST: 45 U/L — ABNORMAL HIGH (ref 10–35)
Albumin: 3.9 g/dL (ref 3.6–5.1)
Alkaline phosphatase (APISO): 96 U/L (ref 35–144)
BUN: 14 mg/dL (ref 7–25)
CO2: 24 mmol/L (ref 20–32)
Calcium: 8.8 mg/dL (ref 8.6–10.3)
Chloride: 104 mmol/L (ref 98–110)
Creat: 0.76 mg/dL (ref 0.70–1.33)
GFR, Est African American: 117 mL/min/{1.73_m2} (ref >=60)
GFR, Est Non African American: 101 mL/min/{1.73_m2} (ref >=60)
Globulin: 2.9 g/dL (calc) (ref 1.9–3.7)
Glucose, Bld: 163 mg/dL — ABNORMAL HIGH (ref 65–139)
Potassium: 3.9 mmol/L (ref 3.5–5.3)
Sodium: 137 mmol/L (ref 135–146)
Total Bilirubin: 0.9 mg/dL (ref 0.2–1.2)
Total Protein: 6.8 g/dL (ref 6.1–8.1)

## 2020-12-22 LAB — CBC WITH DIFFERENTIAL/PLATELET
Absolute Monocytes: 598 cells/uL (ref 200–950)
Basophils Absolute: 48 cells/uL (ref 0–200)
Basophils Relative: 0.7 %
Eosinophils Absolute: 150 cells/uL (ref 15–500)
Eosinophils Relative: 2.2 %
HCT: 49 % (ref 38.5–50.0)
Hemoglobin: 17.4 g/dL — ABNORMAL HIGH (ref 13.2–17.1)
Lymphs Abs: 2849 cells/uL (ref 850–3900)
MCH: 34.5 pg — ABNORMAL HIGH (ref 27.0–33.0)
MCHC: 35.5 g/dL (ref 32.0–36.0)
MCV: 97.2 fL (ref 80.0–100.0)
MPV: 9.2 fL (ref 7.5–12.5)
Monocytes Relative: 8.8 %
Neutro Abs: 3155 cells/uL (ref 1500–7800)
Neutrophils Relative %: 46.4 %
Platelets: 204 10*3/uL (ref 140–400)
RBC: 5.04 10*6/uL (ref 4.20–5.80)
RDW: 11.8 % (ref 11.0–15.0)
Total Lymphocyte: 41.9 %
WBC: 6.8 10*3/uL (ref 3.8–10.8)

## 2020-12-22 LAB — TSH: TSH: 0.61 mIU/L (ref 0.40–4.50)

## 2020-12-22 LAB — LIPID PANEL
Cholesterol: 171 mg/dL (ref <200)
HDL: 44 mg/dL (ref >=40)
LDL Cholesterol (Calc): 107 mg/dL (calc) — ABNORMAL HIGH
Non-HDL Cholesterol (Calc): 127 mg/dL (calc) (ref <130)
Total CHOL/HDL Ratio: 3.9 (calc) (ref <5.0)
Triglycerides: 100 mg/dL (ref <150)

## 2020-12-28 ENCOUNTER — Other Ambulatory Visit: Payer: Self-pay | Admitting: Medical-Surgical

## 2020-12-28 DIAGNOSIS — I83812 Varicose veins of left lower extremities with pain: Secondary | ICD-10-CM

## 2021-09-18 ENCOUNTER — Encounter: Payer: Self-pay | Admitting: Physician Assistant

## 2021-09-18 ENCOUNTER — Ambulatory Visit (INDEPENDENT_AMBULATORY_CARE_PROVIDER_SITE_OTHER): Payer: Self-pay | Admitting: Physician Assistant

## 2021-09-18 ENCOUNTER — Other Ambulatory Visit: Payer: Self-pay

## 2021-09-18 VITALS — Ht 70.0 in | Wt 270.0 lb

## 2021-09-18 DIAGNOSIS — I952 Hypotension due to drugs: Secondary | ICD-10-CM

## 2021-09-18 DIAGNOSIS — I739 Peripheral vascular disease, unspecified: Secondary | ICD-10-CM

## 2021-09-18 DIAGNOSIS — I10 Essential (primary) hypertension: Secondary | ICD-10-CM

## 2021-09-18 DIAGNOSIS — F4321 Adjustment disorder with depressed mood: Secondary | ICD-10-CM

## 2021-09-18 DIAGNOSIS — R42 Dizziness and giddiness: Secondary | ICD-10-CM

## 2021-09-18 DIAGNOSIS — E1165 Type 2 diabetes mellitus with hyperglycemia: Secondary | ICD-10-CM

## 2021-09-18 LAB — POCT GLYCOSYLATED HEMOGLOBIN (HGB A1C): Hemoglobin A1C: 5.6 % (ref 4.0–5.6)

## 2021-09-18 MED ORDER — AMLODIPINE BESYLATE 5 MG PO TABS: 5.0000 mg | ORAL_TABLET | Freq: Every day | ORAL | 0 refills | Status: DC

## 2021-09-18 NOTE — Patient Instructions (Addendum)
Decrease norvasc to 5mg  daily.  Keep everything else the same.  Will get ABI of left leg.   Hipotensin ortosttica Orthostatic Hypotension La presin arterial mide con cunta fuerza, o debilidad, la sangre circulante presiona contra las paredes de las arterias. La hipotensin ortosttica es una disminucin repentina de la presin arterial que ocurre al cambiar de posicin, como cuando se pone de pie despus de estar recostado. Las arterias son los vasos sanguneos que transportan la sangre desde el corazn hacia todas las partes del cuerpo. Cuando la presin arterial es demasiado baja, puede ser que no llegue suficiente sangre al cerebro o al resto de sus rganos. La hipotensin ortosttica puede causar vahdos, sudoracin, latidos cardacos rpidos, visin borrosa y desmayos. Estos sntomas requieren que se investigue ms la causa. Cules son las causas? La hipotensin ortosttica puede deberse a [employer], entre otras: Cambios repentinos en la postura, por ejemplo, ponerse de pie rpidamente despus de haber estado sentado o acostado. Prdida de sangre (anemia) o prdida de lquidos corporales (deshidratacin). Problemas cardacos, problemas neurolgicos o problemas hormonales. Embarazo. Envejecimiento. El riesgo de esta afeccin aumenta a medida que envejece. Infeccin grave (sepsis). Ciertos medicamentos, como medicamentos para la presin arterial alta o medicamentos que hacen que el cuerpo pierda el exceso de lquidos (diurticos). Cules son los signos o sntomas? Los sntomas de esta afeccin pueden incluir los siguientes: Debilidad, vahdos o mareos. Sudoracin. Visin borrosa. Cansancio (fatiga). Latidos cardacos rpidos. Desmayos, cuando los casos son graves. Cmo se diagnostica? Esta afeccin se diagnostica en funcin de lo siguiente: Los sntomas y los antecedentes mdicos. La medicin de la presin arterial. El mdico le controlar la presin arterial cuando usted  est: Acostado. Sentado. De pie. La lectura de la presin arterial se registra con dos nmeros, por ejemplo 120 sobre 80 (o 120/80). El Teacher, English as a foreign language (superior) es la presin sistlica. Es la medida de la presin de las arterias cuando el corazn late. El segundo nmero (inferior) es la presin diastlica. Es la medida de la presin en las arterias cuando el corazn se relaja entre latidos. La presin arterial se mide en una unidad llamada mmHg. Una presin arterial saludable para la mayora de los adultos es de 120/80 mmHg. La hipotensin ortosttica se define como una cada de 20 mmHg en la presin sistlica o una cada de 10 mmHg en la presin diastlica dentro de los 3 minutos de estar de pie. Entre las pruebas e informacin que pueden ayudar a Retail buyer la hipotensin ortosttica se incluyen las siguientes: Sus otros signos vitales, por ejemplo la frecuencia cardaca y Therapist, sports. Pruebas de [place]. Un electrocardiograma (ECG) o un ecocardiograma. Monitor Holter. Este es un dispositivo que se [place] y Development worker, community el ritmo cardaco de manera continua, generalmente durante 24 a 48 horas. Prueba de basculacin. Para realizar esta prueba, se lo sujeta de forma segura a una mesa que lo mover de una posicin [place] a una posicin vertical. Durante la prueba, se controlarn el ritmo cardaco y la presin arterial. Cmo se trata? El tratamiento para esta afeccin puede incluir lo siguiente: Cambios en la dieta. Estos cambios implican comer con ms sal (sodio) o tomar ms agua. Cambiar la dosis de ciertos medicamentos que toma y que podran bajar su presin arterial. Corregir el motivo subyacente de la hipotensin ortosttica. Usar medias de compresin. Medicamentos para elevar la presin arterial. Evitar las acciones desencadenantes de los sntomas. Siga estas instrucciones en su casa: Medicamentos Use los medicamentos de venta libre y los recetados solamente como se  lo haya indicado el  [place] instrucciones del mdico respecto del cambio de la dosis de sus medicamentos actuales, si corresponde. No deje de tomar los medicamentos ni modifique la dosis por su cuenta. Comida y bebida  Beber suficiente lquido como para Theatre manager la orina de color amarillo plido. Consuma la sal adicional nicamente como se lo hayan indicado. No agregue sal adicional a su dieta a menos que se lo indique el mdico. Haga comidas pequeas y frecuentes. Evite ponerse de pie de repente despus de comer. Instrucciones generales  Levntese despacio cuando est acostado o sentado. Esto posibilitar que la presin arterial se adapte. Evite las duchas calientes o el calor excesivo como se lo haya indicado el mdico. Haga actividad fsica regularmente como se lo haya indicado el mdico. Si tiene medias de compresin, selas como se lo hayan indicado. Concurra a [place]. Esto es importante. Comunquese con un mdico si: Tiene fiebre por ms de 2 a 3 das. Tiene ms sed que lo habitual. Se siente mareado o dbil. Solicite ayuda de inmediato si: Electronics engineer. Tiene latidos cardacos rpidos o irregulares. Est sudoroso o siente mareos. Siente que le falta el aire. Se desmaya. Tiene sntomas de un accidente cerebrovascular. BE FAST es una manera fcil de recordar las principales seales de advertencia de un accidente cerebrovascular: B: Balance (equilibrio). Los signos son mareos, dificultad repentina para caminar o prdida del equilibrio. E: Eyes (ojos). Los signos son problemas para ver o un cambio repentino en la visin. F: Face (rostro). Los signos son debilidad repentina o adormecimiento del rostro, o el rostro o el prpado que se caen hacia un lado. A: Arms (brazos). Los signos son debilidad o adormecimiento en un brazo. Esto sucede de repente y generalmente en un lado del cuerpo. S: Speech (habla). Los signos son dificultad para hablar, hablar  arrastrando las palabras o dificultad para comprender lo que las Patent examiner. T: Time (tiempo). Es tiempo de llamar al servicio de Multimedia programmer. Anote la hora a la que [employer] sntomas. Presenta otros signos de un accidente cerebrovascular, como los siguientes: Dolor de cabeza sbito e intenso que no tiene causa aparente. Nuseas o vmitos. Convulsiones. Estos sntomas pueden representar un problema grave que constituye Engineer, maintenance (IT). No espere a ver si los sntomas desaparecen. Solicite atencin mdica de inmediato. Comunquese con el servicio de emergencias de su localidad (911 en los [place]). No conduzca por sus propios medios [employer] hospital. Resumen La hipotensin ortosttica es la cada sbita de la presin arterial. Puede causar vahdos, sudoracin, latidos cardacos rpidos, visin borrosa y [place]. La hipotensin ortosttica se diagnostica [place] la presin arterial mientras est acostado, sentado y luego de pie. El tratamiento puede implicar cambiar su dieta, usar medias de compresin, sentarse lentamente, ajustar sus medicamentos o corregir el motivo subyacente de la hipotensin Sales executive. Obtenga ayuda de inmediato si tiene dolor de pecho, latidos cardacos rpidos o irregulares, o sntomas de un accidente cerebrovascular. Esta informacin no tiene Marine scientist el consejo del mdico. Asegrese de hacerle al mdico cualquier pregunta que tenga. Document Revised: 10/24/2020 Document Reviewed: 10/23/2020 Elsevier Patient Education  [place].

## 2021-09-18 NOTE — Progress Notes (Signed)
Subjective:    Patient ID: Frank Mcclain, male    DOB: 12/25/1960, 61 y.o.   MRN: [id number]  HPI Pt is a 61 yo obese male with T2DM, varicose veins who presents to the clinic with dizziness when bending over and then standing back up. He has noticed this more and more over the past few weeks. He does speak broken english and hard to understand everything he says. His wife did pass away 3 months ago. She was his world and helped him with all of his medications and everything. He denies any syncope, vision changes, headaches, leg pain. He is taking his medication. He does also complain of his left leg hurting when he walks from time to time. Resolves with rest.  .. Active Ambulatory Problems    Diagnosis Date Noted   Uncontrolled type 2 diabetes mellitus with hyperglycemia (HCC) 11/24/2019   Essential hypertension 11/24/2019   Lower extremity edema 11/24/2019   Sleeping difficulty 11/24/2019   Varicose veins of left lower extremity with pain 11/24/2019   Dizziness 09/20/2021   Grief 09/20/2021   Hypotension due to drugs 09/20/2021   Resolved Ambulatory Problems    Diagnosis Date Noted   Pneumonia of both lungs due to infectious organism 11/24/2019   Past Medical History:  Diagnosis Date   Diabetes mellitus without complication (HCC)    Hypertension    Varicose veins of left leg with edema     Review of Systems See HPI.     Objective:   Physical Exam Vitals reviewed.  Constitutional:      Appearance: Normal appearance. He is obese.  HENT:     Head: Normocephalic.     Right Ear: Tympanic membrane, ear canal and external ear normal. There is no impacted cerumen.     Left Ear: Tympanic membrane, ear canal and external ear normal. There is no impacted cerumen.     Nose: Nose normal.     Mouth/Throat:     Mouth: Mucous membranes are moist.  Eyes:     Extraocular Movements: Extraocular movements intact.     Conjunctiva/sclera: Conjunctivae normal.     Pupils: Pupils are  equal, round, and reactive to light.  Cardiovascular:     Rate and Rhythm: Normal rate and regular rhythm.     Pulses: Normal pulses.  Pulmonary:     Effort: Pulmonary effort is normal.  Musculoskeletal:     Right lower leg: No edema.     Left lower leg: Edema present.     Comments: Left leg non-pitting edema and more rusty color. Decreased pedal pulse on left.   Neurological:     General: No focal deficit present.     Mental Status: He is alert and oriented to person, place, and time.     Motor: No weakness.     Coordination: Coordination normal.     Gait: Gait normal.     Deep Tendon Reflexes: Reflexes normal.     Comments: Negative dix hallpike  Psychiatric:     Comments: Tearful when talking about his wife and her passing   Decreased left pedal pulse compared to right.   Marland Kitchen.Orthostatic Vitals for the past 48 hrs (Last 6 readings):  Orthostatic BP Orthostatic Pulse  09/18/21 1405 114/71 81   .Marland Kitchen Today's Vitals   09/18/21 1405  SpO2: 99%  Weight: 270 lb (122.5 kg)  Height: 5\' 10"  (1.778 m)   Body mass index is 38.74 kg/m.   .. Lab Results  Component Value Date  HGBA1C 5.6 09/18/2021       Assessment & Plan:  Marland KitchenMarland KitchenPage was seen today for dizziness.  Diagnoses and all orders for this visit:  Uncontrolled type 2 diabetes mellitus with hyperglycemia (HCC) -     POCT HgB A1C  Essential hypertension -     amLODipine (NORVASC) 5 MG tablet; Take 1 tablet (5 mg total) by mouth daily.  Dizziness  Hypotension due to drugs  Grief   No vertigo Suspect dizziness associated with baseline low BP and some orthostatic changes HO given Decreased norvasc to 5mg .  Discussed compression stockings A1C is great.  Left leg claudication and decreased pulse need ABI.  Declined to fill out PHQ-9 he stated he has a lot of people he can talk to about his sadness. Follow up with PCP in 1 month.

## 2021-09-20 DIAGNOSIS — I739 Peripheral vascular disease, unspecified: Secondary | ICD-10-CM | POA: Insufficient documentation

## 2021-09-20 DIAGNOSIS — I952 Hypotension due to drugs: Secondary | ICD-10-CM | POA: Insufficient documentation

## 2021-09-20 DIAGNOSIS — R42 Dizziness and giddiness: Secondary | ICD-10-CM | POA: Insufficient documentation

## 2021-09-20 DIAGNOSIS — F4321 Adjustment disorder with depressed mood: Secondary | ICD-10-CM | POA: Insufficient documentation

## 2021-09-20 NOTE — Progress Notes (Signed)
Printed ABI and sent to referral box.

## 2021-09-22 IMAGING — US US EXTREM LOW VENOUS*L*
1 series · 13 of 24 positions shown · non-contrast
Comparison: None.

CLINICAL DATA: 59-year-old with pain and swelling in the left lower
extremity. History of previous treatment for varicose veins.



[Series 1: us extrem low venous*left* · 0.08mm/px · 13 of 48 slices shown]
[im 1/48]
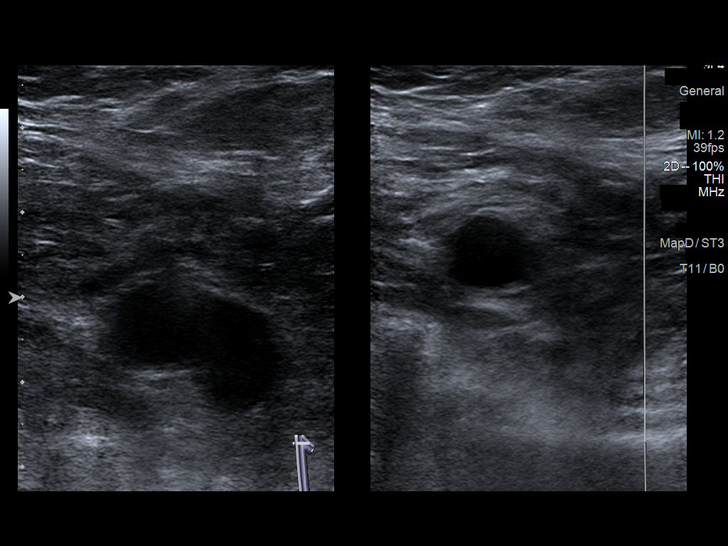
[im 5/48]
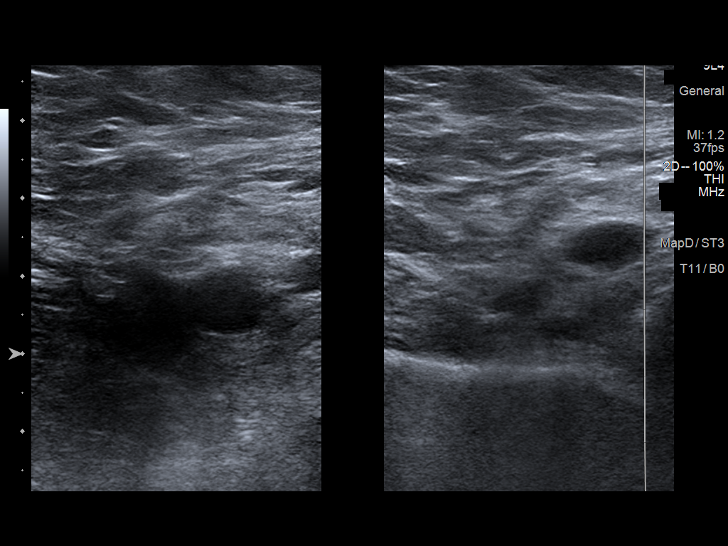
[im 9/48]
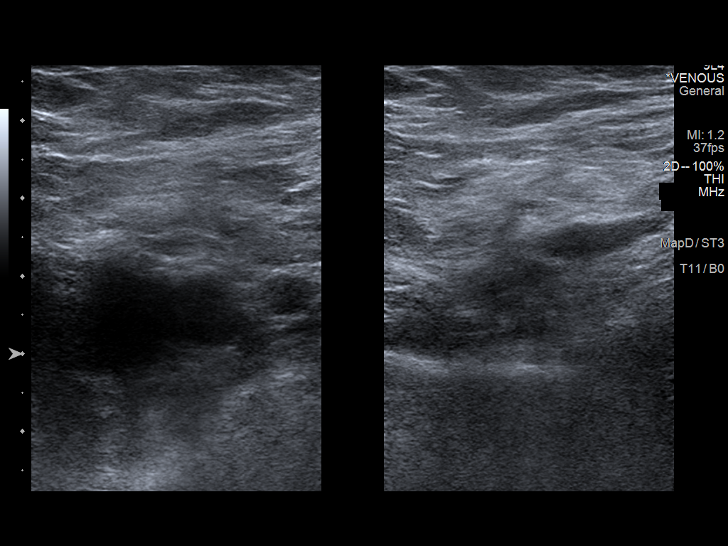
[im 13/48]
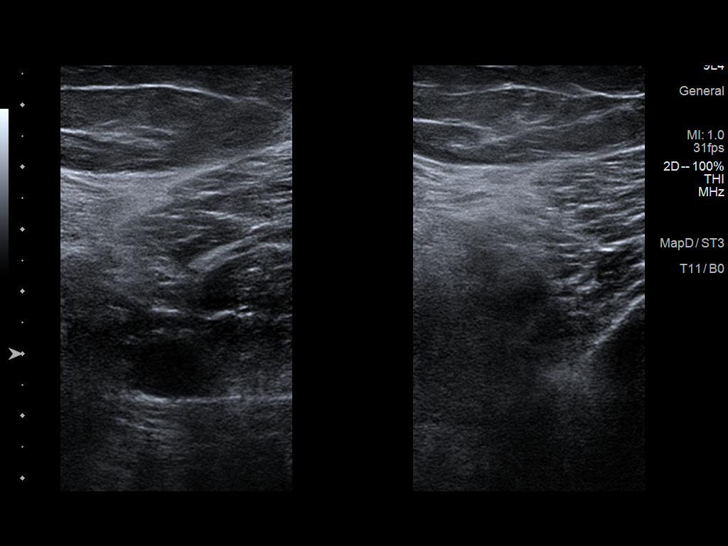
[im 17/48]
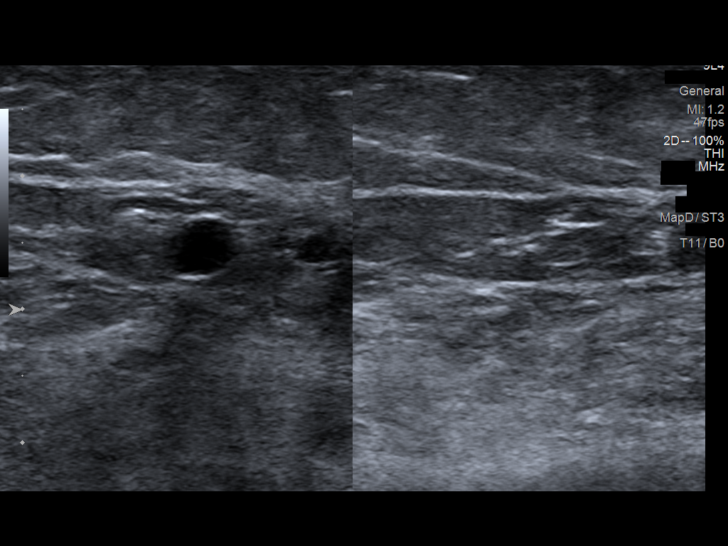
[im 21/48]
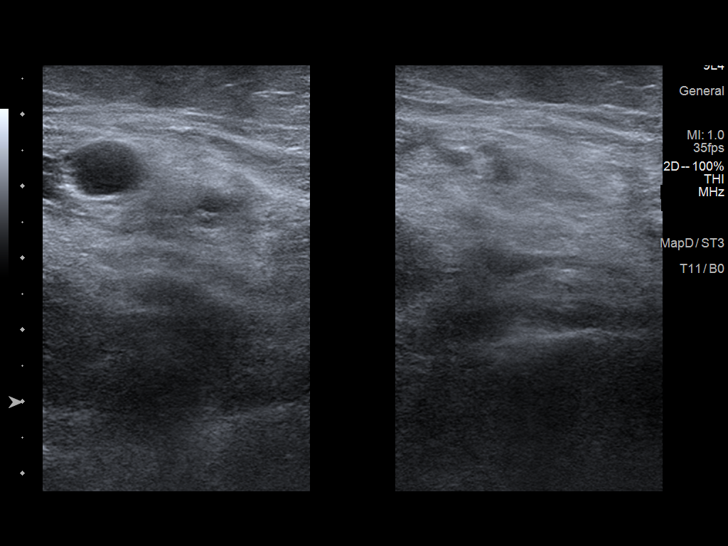
[im 25/48]
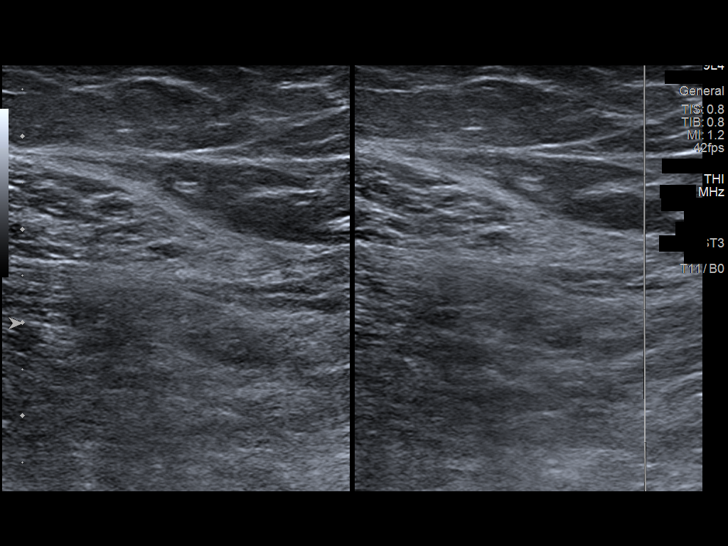
[im 27/48]
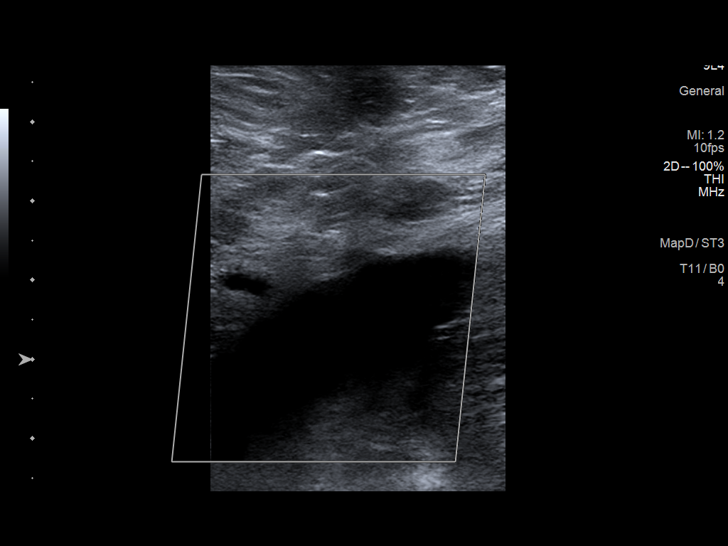
[im 31/48]
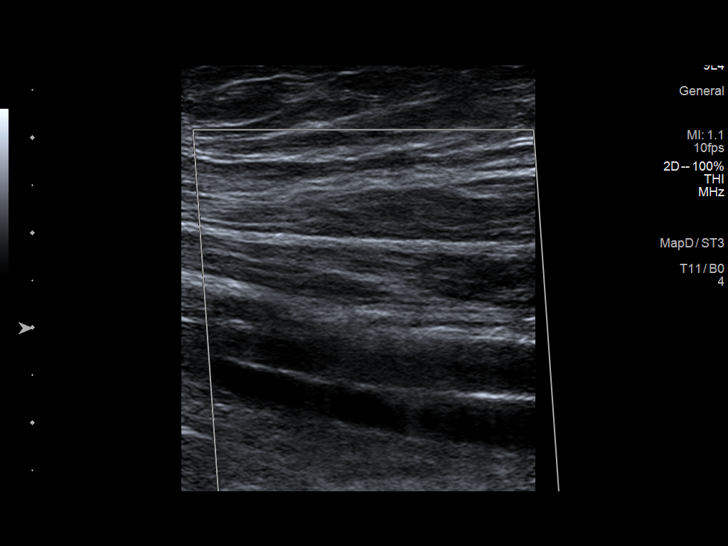
[im 35/48]
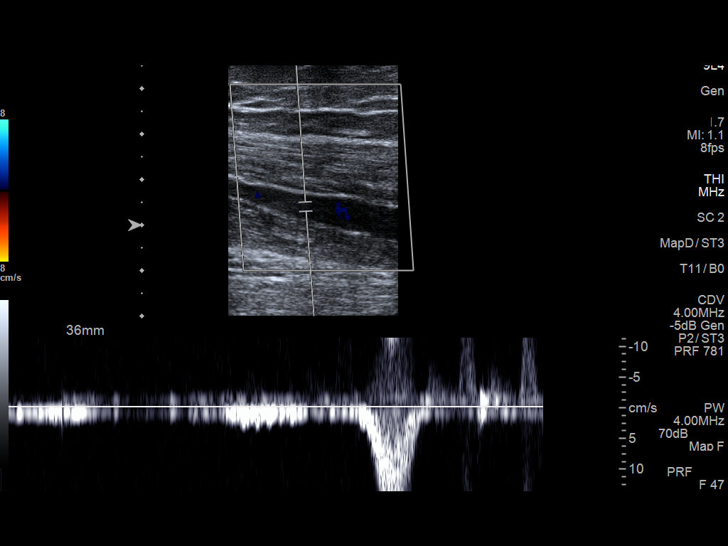
[im 39/48]
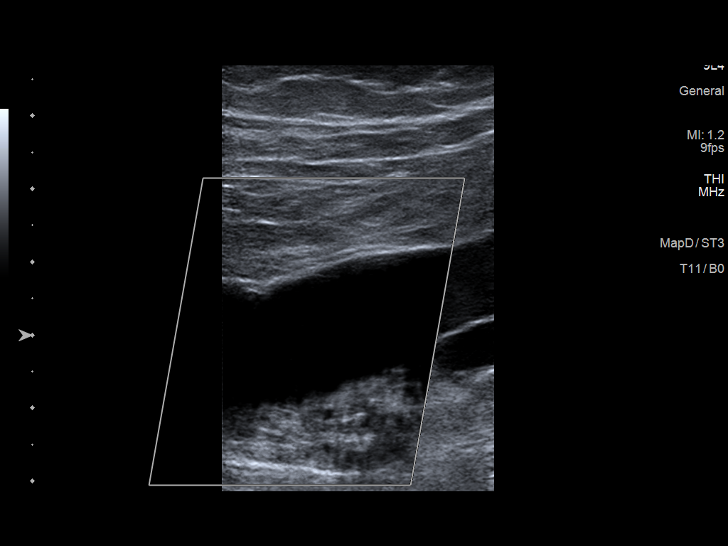
[im 43/48]
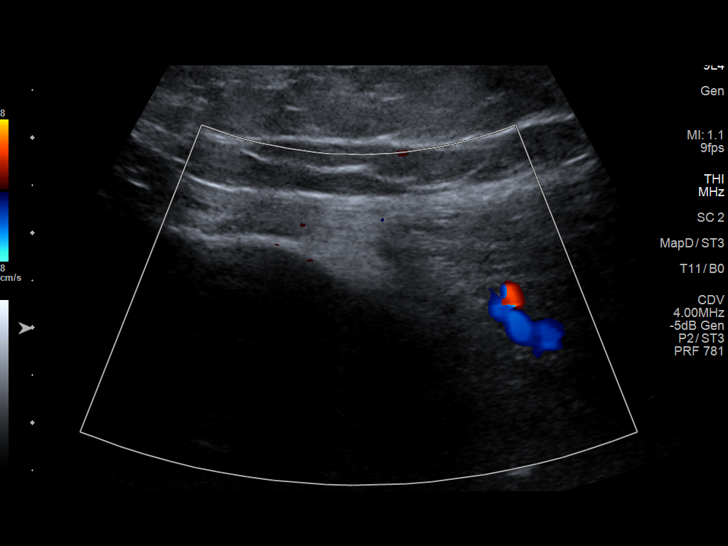
[im 48/48]
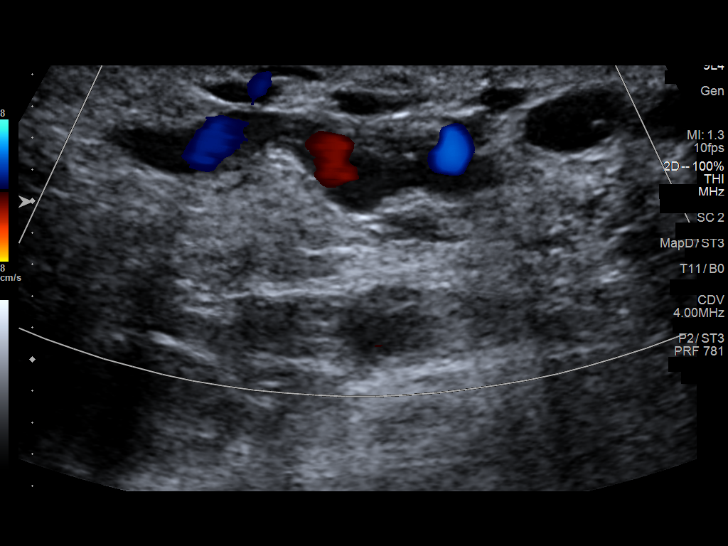

[13 of 24 positions shown; findings below may reference images not displayed]

FINDINGS: Contralateral Common Femoral Vein: Respiratory phasicity is normal
and symmetric with the symptomatic side. No evidence of thrombus.
Normal compressibility.

Common Femoral Vein: No evidence of thrombus. Normal
compressibility, respiratory phasicity and response to augmentation.

Saphenofemoral Junction: Not visualized and likely related to
previous surgery.

Profunda Femoral Vein: No evidence of thrombus. Normal
compressibility and flow on color Doppler imaging.

Femoral Vein: No evidence of thrombus. Normal compressibility,
respiratory phasicity and response to augmentation.

Popliteal Vein: No evidence of thrombus. Normal compressibility,
respiratory phasicity and response to augmentation.

Calf Veins: Visualized left deep calf veins are patent without
thrombus.

Other Findings: Left short saphenous vein is compressible. Multiple
superficial varicosities in left calf. Majority of these varicose
veins demonstrate normal compressibility. There is small amount of
nonocclusive thrombus involving one of varicosities.
IMPRESSION: 1.  Negative for deep venous thrombosis in left lower extremity.
2. Positive for nonocclusive superficial thrombus involving a
varicose vein in the left calf. Multiple varicosities in the left
calf compatible with underlying superficial venous insufficiency.

## 2021-09-22 IMAGING — DX DG CHEST 2V
2 series · 2 of 2 positions shown · non-contrast
Comparison: 10/06/2017

CLINICAL DATA: Chronic cough for 6 months.

EXAM:
CHEST - 2 VIEW

[chest pa]
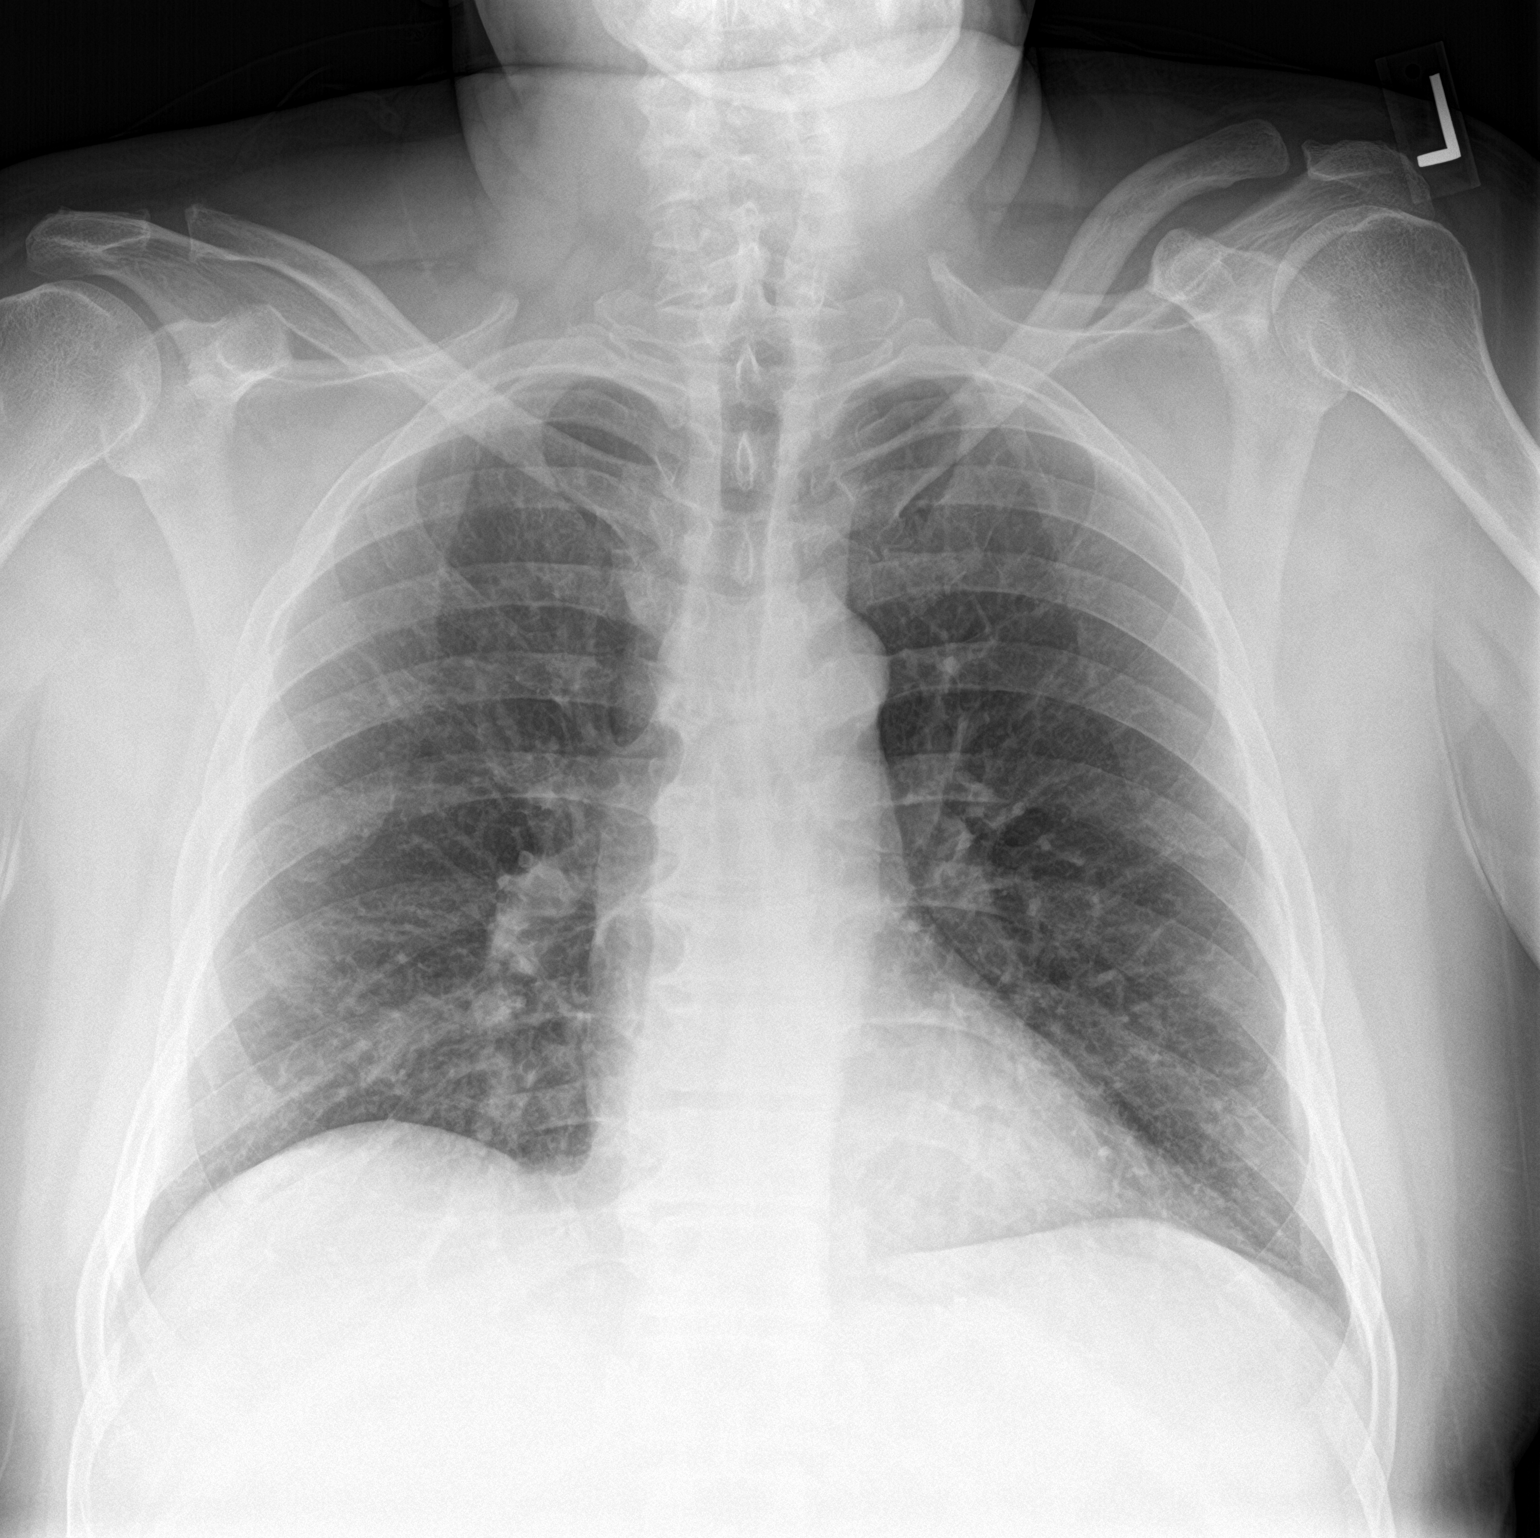

[chest lat]
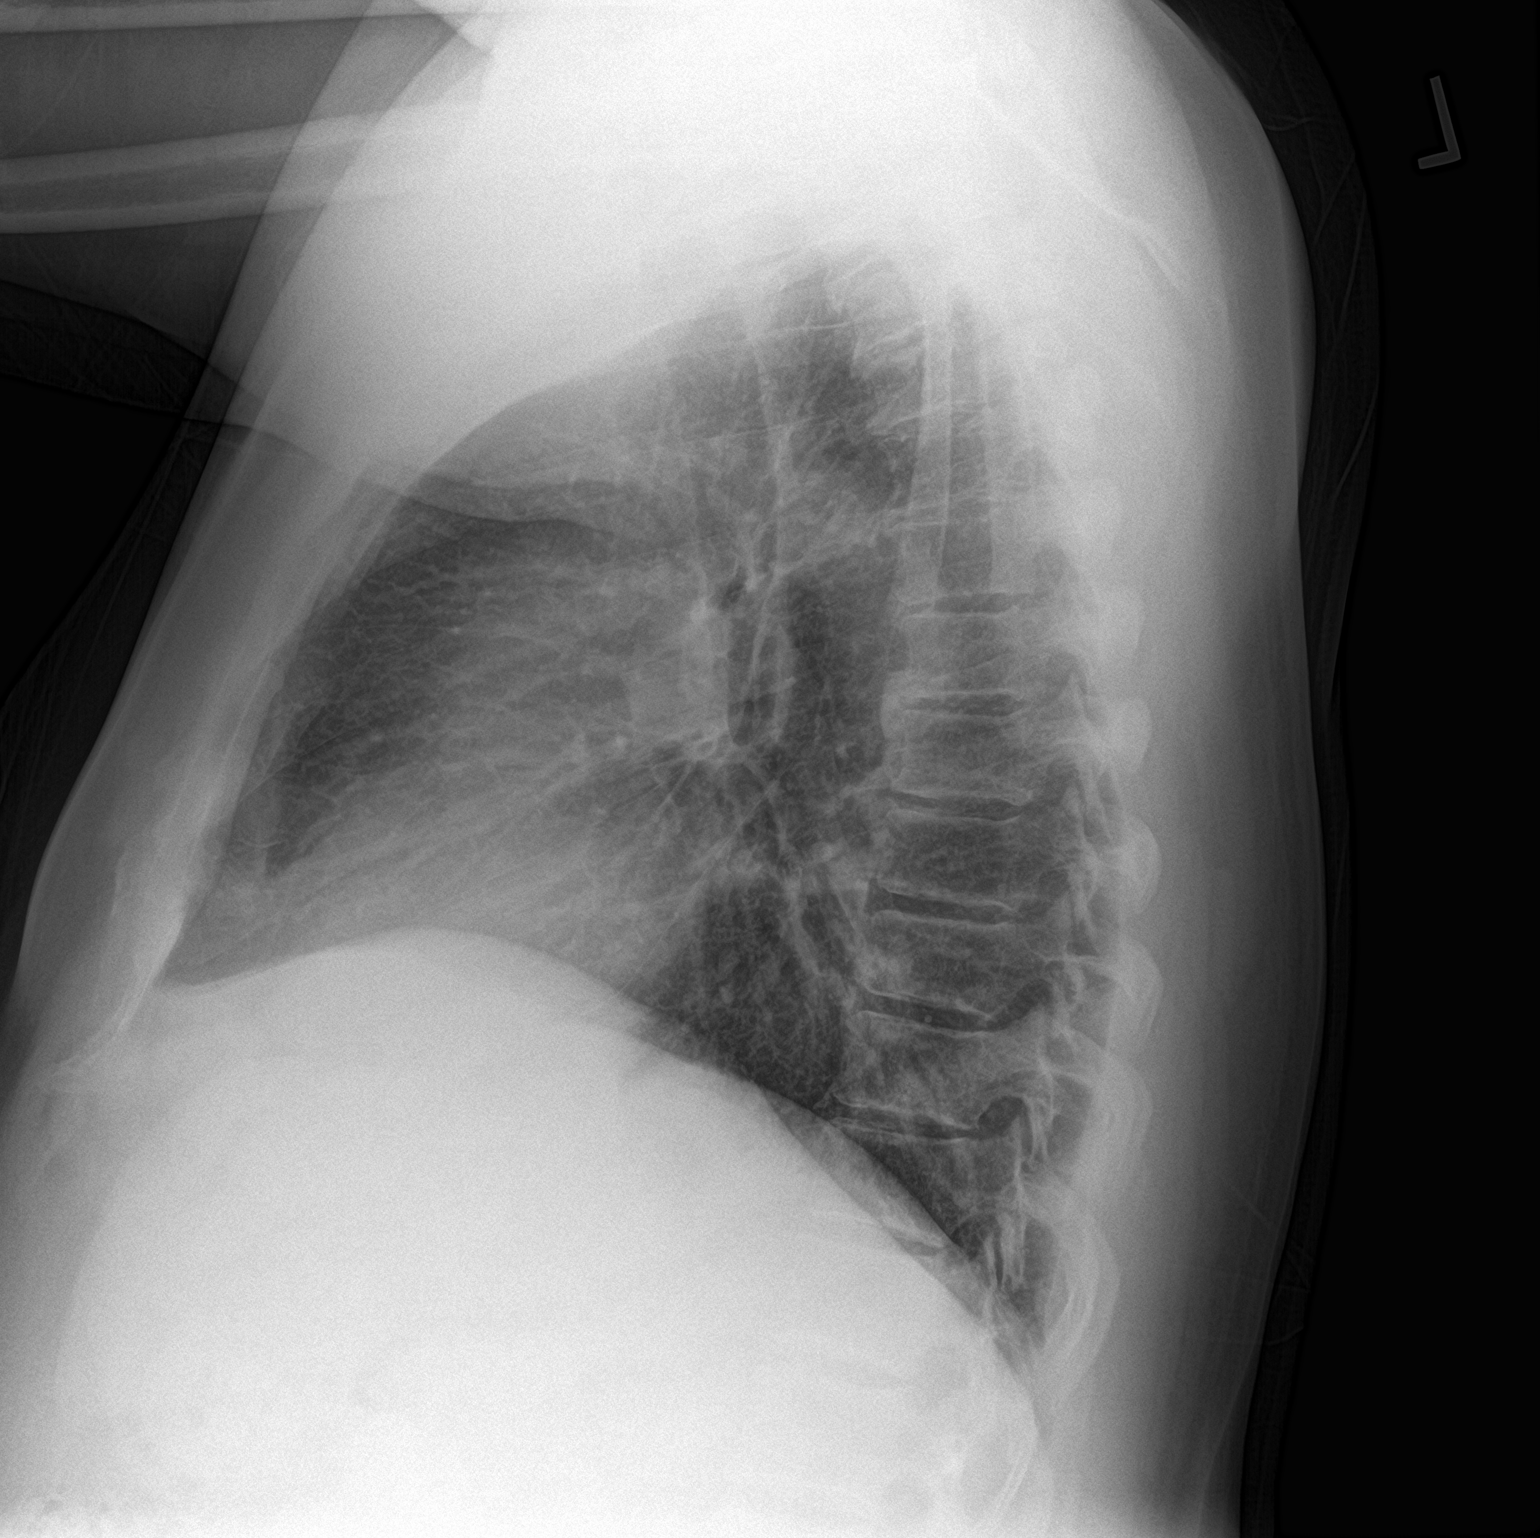

[2 of 2 positions shown; findings below may reference images not displayed]

FINDINGS: Mild right hemidiaphragm elevation. Midline trachea. Normal heart
size and mediastinal contours. No pleural effusion or pneumothorax.
Clear lungs.
IMPRESSION: No acute cardiopulmonary disease.

## 2021-09-23 ENCOUNTER — Encounter (HOSPITAL_COMMUNITY): Payer: Self-pay

## 2021-10-07 ENCOUNTER — Encounter (HOSPITAL_COMMUNITY): Payer: Self-pay

## 2021-10-18 ENCOUNTER — Other Ambulatory Visit: Payer: Self-pay

## 2021-10-18 ENCOUNTER — Encounter: Payer: Self-pay | Admitting: Medical-Surgical

## 2021-10-18 ENCOUNTER — Ambulatory Visit (INDEPENDENT_AMBULATORY_CARE_PROVIDER_SITE_OTHER): Payer: Self-pay | Admitting: Medical-Surgical

## 2021-10-18 VITALS — BP 113/76 | HR 71 | Resp 18 | Ht 70.0 in | Wt 248.0 lb

## 2021-10-18 DIAGNOSIS — R600 Localized edema: Secondary | ICD-10-CM

## 2021-10-18 DIAGNOSIS — E118 Type 2 diabetes mellitus with unspecified complications: Secondary | ICD-10-CM

## 2021-10-18 DIAGNOSIS — I83812 Varicose veins of left lower extremities with pain: Secondary | ICD-10-CM

## 2021-10-18 DIAGNOSIS — I739 Peripheral vascular disease, unspecified: Secondary | ICD-10-CM

## 2021-10-18 DIAGNOSIS — I952 Hypotension due to drugs: Secondary | ICD-10-CM

## 2021-10-18 DIAGNOSIS — F4321 Adjustment disorder with depressed mood: Secondary | ICD-10-CM

## 2021-10-18 MED ORDER — MELOXICAM 15 MG PO TABS: ORAL_TABLET | ORAL | 3 refills | Status: DC

## 2021-10-18 MED ORDER — FUROSEMIDE 20 MG PO TABS: ORAL_TABLET | ORAL | 3 refills | Status: DC

## 2021-10-18 MED ORDER — ESCITALOPRAM OXALATE 5 MG PO TABS: 5.0000 mg | ORAL_TABLET | Freq: Every day | ORAL | 3 refills | Status: DC

## 2021-10-18 NOTE — Progress Notes (Signed)
?HPI with pertinent ROS:  ? ?CC: Dizziness follow-up ? ?HPI: ? ?Professional Spanish interpreter Bayard Hugger [phone] assisted with completion of the appointment today. ? ?Pleasant 61 year old male presenting today for dizziness follow-up.  Was seen approximately 1 month ago by Tandy Gaw, PA where his amlodipine was reduced to 5 mg daily.  He has been taking the new dose as instructed and notes that he is feeling much better now.  He does continue to have some dizzy episodes but this is happening about once every 1 to 2 weeks.  Reports that he is drinking at least 4 bottles of water a day to stay well-hydrated.  Has some mild associated shortness of breath with his dizzy spells but this better after drinking water or soda.  Admits that he does snore pretty loudly however he does not have any daytime sleepiness, morning headaches, etc.  Has never been told he stops breathing in his sleep.  Has never had a sleep study. ? ?Depression-lost his wife about 4 months ago.  He has been dealing with depression since then and is now at a point where he would like to discuss medication options.  He does have multiple family and friends that he can talk to for counseling and is not interested in formal counseling at this time.  Denies SI/HI. ? ?Continues to have intermittent issues with left leg claudication.  ABIs were ordered however the order has expired.  He would like to proceed with having that test completed. ? ?I reviewed the past medical history, family history, social history, surgical history, and allergies today and no changes were needed.  Please see the problem list section below in epic for further details. ? ? ?Physical exam:  ? ?General: Well Developed, well nourished, and in no acute distress.  ?Neuro: Alert and oriented x3.  ?HEENT: Normocephalic, atraumatic.  ?Skin: Warm and dry. ?Cardiac: Regular rate and rhythm, no murmurs rubs or gallops, no lower extremity edema.  ?Respiratory: Clear to auscultation  bilaterally. Not using accessory muscles, speaking in full sentences. ? ?Impression and Recommendations:   ? ?1. Lower extremity edema ?The furosemide 20 mg every other day opposite of hydrochlorothiazide as prescribed.  Checking CMP.  Reordering vascular ultrasound ABIs. ?- furosemide (LASIX) 20 MG tablet; TAKE 1 TABLET BY MOUTH EVERY OTHER DAY, OPPOSITE OF HCTZ  Dispense: 45 tablet; Refill: 3 ?- COMPLETE METABOLIC PANEL WITH GFR ?- VAS [place] ABI WITH/WO TBI; Future ? ?2. Varicose veins of left lower extremity with pain ?See above. ?- furosemide (LASIX) 20 MG tablet; TAKE 1 TABLET BY MOUTH EVERY OTHER DAY, OPPOSITE OF HCTZ  Dispense: 45 tablet; Refill: 3 ?- meloxicam (MOBIC) 15 MG tablet; TAKE 1 TABLET BY MOUTH ONCE DAILY.  Dispense: 90 tablet; Refill: 3 ?- VAS [place] ABI WITH/WO TBI; Future ? ?3. Hypotension due to drugs ?Checking CMP.  Blood pressure stable at 113/76 today.  Continue amlodipine at 5 mg daily. ?- COMPLETE METABOLIC PANEL WITH GFR ? ?4. Grief ?Declined counseling.  Starting Lexapro 5 mg daily.  Reviewed potential side effects and expectations for effectiveness. ? ?5. Controlled type 2 diabetes mellitus with complication, without long-term current use of insulin (HCC) ?Checking lipid panel.  A1c 1 month ago showing that his diabetes is well controlled. ?- Lipid panel ? ?6. Intermittent claudication (HCC) ?ABIs ordered. ?- VAS [place] ABI WITH/WO TBI; Future ? ?Return in about 6 weeks (around 11/29/2021) for mood follow up. ?___________________________________________ ?Thayer Ohm, DNP, APRN, FNP-BC ?Primary Care and Sports Medicine ?[facility]  [place] ?

## 2021-10-19 LAB — COMPLETE METABOLIC PANEL WITH GFR
AG Ratio: 1.6 (calc) (ref 1.0–2.5)
ALT: 24 U/L (ref 9–46)
AST: 25 U/L (ref 10–35)
Albumin: 4.5 g/dL (ref 3.6–5.1)
Alkaline phosphatase (APISO): 96 U/L (ref 35–144)
BUN: 17 mg/dL (ref 7–25)
CO2: 31 mmol/L (ref 20–32)
Calcium: 9.9 mg/dL (ref 8.6–10.3)
Chloride: 102 mmol/L (ref 98–110)
Creat: 0.87 mg/dL (ref 0.70–1.30)
Globulin: 2.9 g/dL (calc) (ref 1.9–3.7)
Glucose, Bld: 51 mg/dL — ABNORMAL LOW (ref 65–139)
Potassium: 3.6 mmol/L (ref 3.5–5.3)
Sodium: 142 mmol/L (ref 135–146)
Total Bilirubin: 1.1 mg/dL (ref 0.2–1.2)
Total Protein: 7.4 g/dL (ref 6.1–8.1)
eGFR: 99 mL/min/{1.73_m2} (ref >=60)

## 2021-10-19 LAB — LIPID PANEL
Cholesterol: 182 mg/dL (ref <200)
HDL: 41 mg/dL (ref >=40)
LDL Cholesterol (Calc): 118 mg/dL (calc) — ABNORMAL HIGH
Non-HDL Cholesterol (Calc): 141 mg/dL (calc) — ABNORMAL HIGH (ref <130)
Total CHOL/HDL Ratio: 4.4 (calc) (ref <5.0)
Triglycerides: 123 mg/dL (ref <150)

## 2021-10-22 ENCOUNTER — Other Ambulatory Visit: Payer: Self-pay | Admitting: Medical-Surgical

## 2021-10-22 DIAGNOSIS — I739 Peripheral vascular disease, unspecified: Secondary | ICD-10-CM

## 2021-10-25 ENCOUNTER — Ambulatory Visit (HOSPITAL_COMMUNITY)
Admission: RE | Admit: 2021-10-25 | Discharge: 2021-10-25 | Disposition: A | Discharge status: Home / Self Care | Payer: Self-pay | Source: Ambulatory Visit | Attending: Cardiovascular Disease | Admitting: Cardiovascular Disease

## 2021-10-25 ENCOUNTER — Other Ambulatory Visit: Payer: Self-pay

## 2021-10-25 DIAGNOSIS — I739 Peripheral vascular disease, unspecified: Secondary | ICD-10-CM | POA: Insufficient documentation

## 2021-11-01 ENCOUNTER — Encounter: Payer: Self-pay | Admitting: Neurology

## 2021-12-06 ENCOUNTER — Encounter: Payer: Self-pay | Admitting: Medical-Surgical

## 2021-12-06 ENCOUNTER — Ambulatory Visit (INDEPENDENT_AMBULATORY_CARE_PROVIDER_SITE_OTHER): Payer: Self-pay | Admitting: Medical-Surgical

## 2021-12-06 VITALS — BP 124/82 | HR 58 | Resp 20 | Ht 70.0 in | Wt 248.1 lb

## 2021-12-06 DIAGNOSIS — F4321 Adjustment disorder with depressed mood: Secondary | ICD-10-CM

## 2021-12-06 MED ORDER — ESCITALOPRAM OXALATE 5 MG PO TABS: 5.0000 mg | ORAL_TABLET | Freq: Every day | ORAL | 1 refills | Status: DC

## 2021-12-06 NOTE — Progress Notes (Signed)
?  HPI with pertinent ROS:  ? ?CC: Grief follow-up ? ?HPI: ?Professional interpreter Stann Mainland [phone]) provided services during this appointment. ? ?Frank 61 year old Mcclain presenting today for follow-up regarding grief.  Approximately 6 weeks ago, he was started on Lexapro 5 mg daily which he took daily as prescribed for a few weeks however he did stop this medication about 3 weeks ago.  Notes that he stopped it to see how he felt off of the medication.  Reports that he is doing well overall and has no concerns with his mood and grief.  Has been managing his daily activities without significant drop in motivation, fatigue, or changes in eating behaviors.  Denies SI/HI.  Notes that he would like to restart the medication to see if he notices a difference after starting it in his overall mood and wellbeing. ? ?I reviewed the past medical history, family history, social history, surgical history, and allergies today and no changes were needed.  Please see the problem list section below in epic for further details. ? ? ?Physical exam:  ? ?General: Well Developed, well nourished, and in no acute distress.  ?Neuro: Alert and oriented x3.  ?HEENT: Normocephalic, atraumatic.  ?Skin: Warm and dry. ?Cardiac: Regular rate and rhythm, no murmurs rubs or gallops, no lower extremity edema.  ?Respiratory: Clear to auscultation bilaterally. Not using accessory muscles, speaking in full sentences. ? ?Impression and Recommendations:   ? ?1. Grief ?Per patient report, he is doing well even without the Lexapro however he would like to restart to see if this makes a difference in his overall mood and wellbeing.  Agree that this would not be harmful to try so sending in Lexapro 5 mg daily.  We will plan to follow-up in about 3 months regarding his mood and chronic diseases. ? ?Return in about 3 months (around 03/08/2022) for mood follow up. ?___________________________________________ ?Clearnce Sorrel, DNP, APRN, FNP-BC ?Primary Care and  Sports Medicine ?[place] ?

## 2022-03-07 ENCOUNTER — Encounter: Payer: Self-pay | Admitting: Medical-Surgical

## 2022-03-07 ENCOUNTER — Ambulatory Visit (INDEPENDENT_AMBULATORY_CARE_PROVIDER_SITE_OTHER): Payer: Self-pay | Admitting: Medical-Surgical

## 2022-03-07 VITALS — BP 123/80 | HR 70 | Resp 20 | Ht 70.0 in | Wt 245.0 lb

## 2022-03-07 DIAGNOSIS — E1165 Type 2 diabetes mellitus with hyperglycemia: Secondary | ICD-10-CM

## 2022-03-07 DIAGNOSIS — G894 Chronic pain syndrome: Secondary | ICD-10-CM

## 2022-03-07 DIAGNOSIS — E785 Hyperlipidemia, unspecified: Secondary | ICD-10-CM

## 2022-03-07 DIAGNOSIS — I83812 Varicose veins of left lower extremities with pain: Secondary | ICD-10-CM

## 2022-03-07 DIAGNOSIS — F4321 Adjustment disorder with depressed mood: Secondary | ICD-10-CM

## 2022-03-07 DIAGNOSIS — E1169 Type 2 diabetes mellitus with other specified complication: Secondary | ICD-10-CM

## 2022-03-07 DIAGNOSIS — I10 Essential (primary) hypertension: Secondary | ICD-10-CM

## 2022-03-07 LAB — POCT UA - MICROALBUMIN
Albumin/Creatinine Ratio, Urine, POC: <30
Creatinine, POC: 300 mg/dL
Microalbumin Ur, POC: 30 mg/L

## 2022-03-07 LAB — POCT GLYCOSYLATED HEMOGLOBIN (HGB A1C): HbA1c, POC (controlled diabetic range): 5.7 % (ref 0.0–7.0)

## 2022-03-07 MED ORDER — MELOXICAM 15 MG PO TABS: 15.0000 mg | ORAL_TABLET | Freq: Every day | ORAL | 3 refills | Status: DC | PRN

## 2022-03-07 NOTE — Progress Notes (Signed)
Established Patient Office Visit  Subjective   Patient ID: Frank Mcclain, male   DOB: [birth year] Age: 61 y.o. MRN: [id number]   Chief Complaint  Patient presents with   Follow-up    HPI Pleasant 61 year old male presenting today for the following:  DM: No longer taking glipizide or metformin to control his diabetes.  He has lost approximately 30 pounds in the last year and has stopped drinking alcohol.  Notes that he occasionally checks his sugars and they look "good" but is unable to provide a specific number.  HTN: No longer taking hydrochlorothiazide or amlodipine.  Not regularly checking blood pressure at home but when he does he reports it is usually "good". Denies CP, SOB, palpitations, lower extremity edema, dizziness, headaches, or vision changes.  Chronic pain: No longer taking gabapentin or meloxicam for chronic pain.  Notes that his right knee is better however his left knee is bothering him and limiting his daily activities and mobility.   Mood: Overall doing well since his wife passed away.  He seems to have adjusted well and is able to take care of himself without needing assistance.  Was previously prescribed Lexapro but he has stopped this medication and is doing well overall.  Denies SI/HI.  Objective:    Vitals:   03/07/22 0949  BP: 123/80  Pulse: 70  Resp: 20  Height: 5\' 10"  (1.778 m)  Weight: 245 lb (111.1 kg)  SpO2: 95%  BMI (Calculated): 35.15   Physical Exam Vitals reviewed.  Constitutional:      General: He is not in acute distress.    Appearance: Normal appearance. He is obese. He is not ill-appearing.  HENT:     Head: Normocephalic.  Cardiovascular:     Rate and Rhythm: Normal rate.     Pulses: Normal pulses.     Heart sounds: Normal heart sounds. No murmur heard.    No friction rub. No gallop.  Pulmonary:     Effort: Pulmonary effort is normal. No respiratory distress.     Breath sounds: Normal breath sounds.  Skin:    General: Skin is  warm and dry.  Neurological:     Mental Status: He is alert and oriented to person, place, and time.  Psychiatric:        Mood and Affect: Mood normal.        Behavior: Behavior normal.        Thought Content: Thought content normal.        Judgment: Judgment normal.    Results for orders placed or performed in visit on 03/07/22 (from the past 24 hour(s))  POCT HgB A1C     Status: None   Collection Time: 03/07/22 10:08 AM  Result Value Ref Range   Hemoglobin A1C     HbA1c POC (<> result, manual entry)     HbA1c, POC (prediabetic range)     HbA1c, POC (controlled diabetic range) 5.7 0.0 - 7.0 %  POCT UA - Microalbumin     Status: Normal   Collection Time: 03/07/22 10:15 AM  Result Value Ref Range   Microalbumin Ur, POC 30 mg/L   Creatinine, POC 300 mg/dL   Albumin/Creatinine Ratio, Urine, POC <30        The 10-year ASCVD risk score (Arnett DK, et al., 2019) is: 14.6%   Values used to calculate the score:     Age: 43 years     Sex: Male     Is Non-Hispanic African American: No  Diabetic: Yes     Tobacco smoker: No     Systolic Blood Pressure: 123 mmHg     Is BP treated: No     HDL Cholesterol: 41 mg/dL     Total Cholesterol: 182 mg/dL   Assessment & Plan:   1. Essential hypertension Blood pressure at goal today despite being off of all medications.  Continue low-sodium diet.  Recommend regular intentional exercise with goal for weight loss.  Monitor blood pressure at home a couple of times per week with a goal of 130/80 or less.  Avoid alcohol intake.  2. Controlled type 2 diabetes mellitus with hyperglycemia (HCC) POCT hemoglobin A1c 5.7% today.  Microalbumin normal.  Continue following a diabetic diet and avoid concentrated sweets and simple carbohydrates. - POCT UA - Microalbumin - POCT HgB A1C  3. Chronic pain disorder Overall doing well since he lost weight and has been able to come off both gabapentin and meloxicam.  With his knee bothering him, suspect this  is more of an arthritic condition.  Restarting meloxicam 15 mg daily for pain.  Advised to avoid other over-the-counter NSAIDs.  4. Hyperlipidemia associated with type 2 diabetes mellitus (HCC) Deferring labs today due to self-pay status.  We will plan to check this with his next visit. Not currently taking any medications.  Continue efforts for weight loss and aim for a low-fat heart healthy diet.  5. Varicose veins of left lower extremity with pain Restarting meloxicam. - meloxicam (MOBIC) 15 MG tablet; Take 1 tablet (15 mg total) by mouth daily as needed for pain.  Dispense: 90 tablet; Refill: 3  6. Grief He seems to be processing fairly well after losing his wife.  No longer taking medications but no significant red flags for emotional upheaval.  He does have his dog at home that is a great companion and helps keep him busy.  Not currently interested or seeking out other relationships at this time.  Return in about 6 months (around 09/07/2022) for DM/HTN/HLD follow up.  ___________________________________________ Thayer Ohm, DNP, APRN, FNP-BC Primary Care and Sports Medicine [facility]

## 2022-08-09 ENCOUNTER — Encounter: Payer: Self-pay | Admitting: Emergency Medicine

## 2022-08-09 ENCOUNTER — Ambulatory Visit
Admission: EM | Admit: 2022-08-09 | Discharge: 2022-08-09 | Disposition: A | Discharge status: Home / Self Care | Payer: Self-pay | Attending: Family Medicine | Admitting: Family Medicine

## 2022-08-09 DIAGNOSIS — J069 Acute upper respiratory infection, unspecified: Secondary | ICD-10-CM

## 2022-08-09 DIAGNOSIS — H66001 Acute suppurative otitis media without spontaneous rupture of ear drum, right ear: Secondary | ICD-10-CM

## 2022-08-09 MED ORDER — AMOXICILLIN-POT CLAVULANATE 875-125 MG PO TABS: 1.0000 | ORAL_TABLET | Freq: Two times a day (BID) | ORAL | 0 refills | Status: DC

## 2022-08-09 MED ORDER — IBUPROFEN 800 MG PO TABS: 800.0000 mg | ORAL_TABLET | Freq: Three times a day (TID) | ORAL | 0 refills | Status: DC

## 2022-08-09 NOTE — ED Provider Notes (Signed)
[name]    CSN: [id number] Arrival date & time: 08/09/22  0912      History   Chief Complaint Chief Complaint  Patient presents with   Otalgia    HPI Frank Mcclain is a 62 y.o. male.   HPI Patient has been sick for 3 days.  Cough cold runny nose and sore throat.  Last night developed ear pain.  Could not sleep because of his ear pain.  States his hearing is normal.  No one else at home or work is sick.  Past Medical History:  Diagnosis Date   Diabetes mellitus without complication (HCC)    Hypertension    Varicose veins of left leg with edema     Patient Active Problem List   Diagnosis Date Noted   Dizziness 09/20/2021   Grief 09/20/2021   Hypotension due to drugs 09/20/2021   Intermittent claudication (HCC) 09/20/2021   Uncontrolled type 2 diabetes mellitus with hyperglycemia (HCC) 11/24/2019   Essential hypertension 11/24/2019   Lower extremity edema 11/24/2019   Sleeping difficulty 11/24/2019   Varicose veins of left lower extremity with pain 11/24/2019   Chronic pain disorder 11/26/2018   Lumbar degenerative disc disease 11/26/2018   Primary osteoarthritis of right knee 11/26/2018   Lumbar foraminal stenosis 11/26/2018   Acute meniscal tear of right knee 03/17/2018    Past Surgical History:  Procedure Laterality Date   KNEE SURGERY     VARICOSE VEIN SURGERY         Home Medications    Prior to Admission medications   Medication Sig Start Date End Date Taking? Authorizing Provider  amoxicillin-clavulanate (AUGMENTIN) 875-125 MG tablet Take 1 tablet by mouth every 12 (twelve) hours. 08/09/22  Yes Eustace Moore, MD  ibuprofen (ADVIL) 800 MG tablet Take 1 tablet (800 mg total) by mouth 3 (three) times daily. 08/09/22  Yes Eustace Moore, MD  meloxicam (MOBIC) 15 MG tablet Take 1 tablet (15 mg total) by mouth daily as needed for pain. 03/07/22   Christen Butter, NP    Family History Family History  Family history unknown: Yes     Social History Social History   Tobacco Use   Smoking status: Never   Smokeless tobacco: Never  Vaping Use   Vaping Use: Never used  Substance Use Topics   Alcohol use: Yes    Alcohol/week: 21.0 - 42.0 standard drinks of alcohol    Types: 21 - 42 Cans of beer per week    Comment: 3-6 beers/day   Drug use: No     Allergies   Patient has no known allergies.   Review of Systems Review of Systems  See HPI Physical Exam Triage Vital Signs ED Triage Vitals  Enc Vitals Group     BP 08/09/22 0939 (!) 134/90     Pulse Rate 08/09/22 0939 (!) 57     Resp 08/09/22 0939 18     Temp 08/09/22 0939 98 F (36.7 C)     Temp Source 08/09/22 0939 Oral     SpO2 08/09/22 0939 95 %     Weight 08/09/22 0941 245 lb (111.1 kg)     Height 08/09/22 0941 5\' 11"  (1.803 m)     Head Circumference --      Peak Flow --      Pain Score 08/09/22 0941 6     Pain Loc --      Pain Edu? --      Excl. in GC? --  No data found.  Updated Vital Signs BP (!) 134/90 (BP Location: Right Arm)   Pulse (!) 57   Temp 98 F (36.7 C) (Oral)   Resp 18   Ht 5\' 11"  (1.803 m)   Wt 111.1 kg   SpO2 95%   BMI 34.17 kg/m       Physical Exam Constitutional:      General: He is not in acute distress.    Appearance: He is well-developed. He is obese.     Comments: Appears uncomfortable  HENT:     Head: Normocephalic and atraumatic.     Left Ear: There is impacted cerumen.     Ears:     Comments: Right TM is bulging, dull, erythematous left TM obscured by cerumen.    Nose: No congestion or rhinorrhea.     Mouth/Throat:     Pharynx: Posterior oropharyngeal erythema present.  Eyes:     Conjunctiva/sclera: Conjunctivae normal.     Pupils: Pupils are equal, round, and reactive to light.  Cardiovascular:     Rate and Rhythm: Normal rate and regular rhythm.     Heart sounds: Normal heart sounds.  Pulmonary:     Effort: Pulmonary effort is normal. No respiratory distress.     Breath sounds: Rhonchi  present.  Abdominal:     General: There is no distension.     Palpations: Abdomen is soft.  Musculoskeletal:        General: Normal range of motion.     Cervical back: Normal range of motion.  Lymphadenopathy:     Cervical: No cervical adenopathy.  Skin:    General: Skin is warm and dry.  Neurological:     Mental Status: He is alert.      UC Treatments / Results  Labs (all labs ordered are listed, but only abnormal results are displayed) Labs Reviewed - No data to display  EKG   Radiology No results found.  Procedures Procedures (including critical care time)  Medications Ordered in UC Medications - No data to display  Initial Impression / Assessment and Plan / UC Course  I have reviewed the triage vital signs and the nursing notes.  Pertinent labs & imaging results that were available during my care of the patient were reviewed by me and considered in my medical decision making (see chart for details).    Final Clinical Impressions(s) / UC Diagnoses   Final diagnoses:  Non-recurrent acute suppurative otitis media of right ear without spontaneous rupture of tympanic membrane  Viral URI with cough     Discharge Instructions      Take the antibiotic 2 times a day Take ibuprofen 3 times a day as needed for pain.  Take medicines with food See your doctor if not improving by next week   ED Prescriptions     Medication Sig Dispense Auth. Provider   amoxicillin-clavulanate (AUGMENTIN) 875-125 MG tablet Take 1 tablet by mouth every 12 (twelve) hours. 14 tablet Raylene Everts, MD   ibuprofen (ADVIL) 800 MG tablet Take 1 tablet (800 mg total) by mouth 3 (three) times daily. 21 tablet Raylene Everts, MD      PDMP not reviewed this encounter.   Raylene Everts, MD 08/09/22 1007

## 2022-08-09 NOTE — Discharge Instructions (Addendum)
Take the antibiotic 2 times a day Take ibuprofen 3 times a day as needed for pain.  Take medicines with food See your doctor if not improving by next week

## 2022-08-09 NOTE — ED Triage Notes (Signed)
Patient c/o right ear pain last night.  He was unable to sleep due to the pain.  Patient has taken OTC Naproxen for pain.

## 2022-09-08 ENCOUNTER — Encounter: Payer: Self-pay | Admitting: Medical-Surgical

## 2022-09-08 ENCOUNTER — Ambulatory Visit: Payer: Self-pay | Admitting: Medical-Surgical

## 2022-09-08 VITALS — BP 120/76 | HR 58 | Resp 20 | Ht 71.0 in | Wt 245.0 lb

## 2022-09-08 DIAGNOSIS — E785 Hyperlipidemia, unspecified: Secondary | ICD-10-CM

## 2022-09-08 DIAGNOSIS — I83812 Varicose veins of left lower extremities with pain: Secondary | ICD-10-CM

## 2022-09-08 DIAGNOSIS — I10 Essential (primary) hypertension: Secondary | ICD-10-CM

## 2022-09-08 DIAGNOSIS — E118 Type 2 diabetes mellitus with unspecified complications: Secondary | ICD-10-CM

## 2022-09-08 DIAGNOSIS — E1169 Type 2 diabetes mellitus with other specified complication: Secondary | ICD-10-CM

## 2022-09-08 DIAGNOSIS — D751 Secondary polycythemia: Secondary | ICD-10-CM

## 2022-09-08 LAB — POCT GLYCOSYLATED HEMOGLOBIN (HGB A1C): Hemoglobin A1C: 5.7 % — AB (ref 4.0–5.6)

## 2022-09-08 NOTE — Progress Notes (Signed)
Established Patient Office Visit  Subjective   Patient ID: Frank Mcclain, male   DOB: [birth year] Age: 62 y.o. MRN: [id number]   Chief Complaint  Patient presents with   Follow-up   Diabetes   HPI Pleasant 62 year old male presenting today for the following:  Diabetes: Not checking blood sugars at home.  He is no longer on diabetic medications.  Stays very active and is doing intentional exercises regularly.  Working to follow diabetic diet recommendations.  Hypertension: Not currently taking any medications for hypertension management.  Reports checking his blood pressure at home with "good" readings.  Following a low-sodium diet.  Exercise as above. Denies CP, SOB, palpitations, dizziness, headaches, or vision changes.  Hyperlipidemia: Not currently taking any medications to manage cholesterol.   Objective:    Vitals:   09/08/22 0838  BP: 120/76  Pulse: (!) 58  Resp: 20  Height: 5\' 11"  (1.803 m)  Weight: 245 lb 0.6 oz (111.1 kg)  SpO2: 97%  BMI (Calculated): 34.19    Physical Exam Vitals reviewed.  Constitutional:      General: He is not in acute distress.    Appearance: Normal appearance. He is obese. He is not ill-appearing.  HENT:     Head: Normocephalic and atraumatic.  Cardiovascular:     Rate and Rhythm: Normal rate and regular rhythm.     Pulses: Normal pulses.     Heart sounds: Normal heart sounds. No murmur heard.    No friction rub. No gallop.  Pulmonary:     Effort: Pulmonary effort is normal. No respiratory distress.     Breath sounds: Normal breath sounds.  Skin:    General: Skin is warm and dry.  Neurological:     Mental Status: He is alert and oriented to person, place, and time.  Psychiatric:        Mood and Affect: Mood normal.        Behavior: Behavior normal.        Thought Content: Thought content normal.        Judgment: Judgment normal.    Results for orders placed or performed in visit on 09/08/22 (from the past 24 hour(s))  POCT  HgB A1C     Status: Abnormal   Collection Time: 09/08/22  8:41 AM  Result Value Ref Range   Hemoglobin A1C 5.7 (A) 4.0 - 5.6 %   HbA1c POC (<> result, manual entry)     HbA1c, POC (prediabetic range)     HbA1c, POC (controlled diabetic range)         The 10-year ASCVD risk score (Arnett DK, et al., 2019) is: 14%   Values used to calculate the score:     Age: 32 years     Sex: Male     Is Non-Hispanic African American: No     Diabetic: Yes     Tobacco smoker: No     Systolic Blood Pressure: 789 mmHg     Is BP treated: No     HDL Cholesterol: 41 mg/dL     Total Cholesterol: 182 mg/dL   Assessment & Plan:   1. Controlled type 2 diabetes mellitus with complication, without long-term current use of insulin (HCC) Hemoglobin A1c 5.7% indicating excellent control of diabetes without medications.  Continue diabetic diet recommendations and regular intentional exercise with a goal for weight loss to healthy weight. - POCT HgB A1C  2. Essential hypertension Checking labs.  Blood pressure looks great today.  Continue low-sodium diet and regular  intentional exercise. - COMPLETE METABOLIC PANEL WITH GFR - Lipid panel - CBC  3. Hyperlipidemia associated with type 2 diabetes mellitus (HCC) Checking lipids today.  4. Varicose veins of left lower extremity with pain Continue compression socks/stockings.  Low-sodium diet.  Meloxicam 15 mg daily as needed.  Return in about 6 months (around 03/09/2023) for DM/HTN/HLD follow up.  ___________________________________________ Clearnce Sorrel, DNP, APRN, FNP-BC Primary Care and [place]

## 2022-09-09 ENCOUNTER — Other Ambulatory Visit: Payer: Self-pay

## 2022-09-09 LAB — CBC
HCT: 50.3 % — ABNORMAL HIGH (ref 38.5–50.0)
Hemoglobin: 17.9 g/dL — ABNORMAL HIGH (ref 13.2–17.1)
MCH: 33.6 pg — ABNORMAL HIGH (ref 27.0–33.0)
MCHC: 35.6 g/dL (ref 32.0–36.0)
MCV: 94.5 fL (ref 80.0–100.0)
MPV: 9.5 fL (ref 7.5–12.5)
Platelets: 197 10*3/uL (ref 140–400)
RBC: 5.32 10*6/uL (ref 4.20–5.80)
RDW: 12.6 % (ref 11.0–15.0)
WBC: 5.3 10*3/uL (ref 3.8–10.8)

## 2022-09-09 LAB — COMPLETE METABOLIC PANEL WITH GFR
AG Ratio: 1.4 (calc) (ref 1.0–2.5)
ALT: 16 U/L (ref 9–46)
AST: 16 U/L (ref 10–35)
Albumin: 4.1 g/dL (ref 3.6–5.1)
Alkaline phosphatase (APISO): 112 U/L (ref 35–144)
BUN: 14 mg/dL (ref 7–25)
CO2: 28 mmol/L (ref 20–32)
Calcium: 9.2 mg/dL (ref 8.6–10.3)
Chloride: 105 mmol/L (ref 98–110)
Creat: 0.76 mg/dL (ref 0.70–1.30)
Globulin: 2.9 g/dL (calc) (ref 1.9–3.7)
Glucose, Bld: 126 mg/dL — ABNORMAL HIGH (ref 65–99)
Potassium: 4.2 mmol/L (ref 3.5–5.3)
Sodium: 141 mmol/L (ref 135–146)
Total Bilirubin: 1 mg/dL (ref 0.2–1.2)
Total Protein: 7 g/dL (ref 6.1–8.1)
eGFR: 104 mL/min/{1.73_m2} (ref >=60)

## 2022-09-09 LAB — LIPID PANEL
Cholesterol: 158 mg/dL (ref <200)
HDL: 41 mg/dL (ref >=40)
LDL Cholesterol (Calc): 98 mg/dL (calc)
Non-HDL Cholesterol (Calc): 117 mg/dL (calc) (ref <130)
Total CHOL/HDL Ratio: 3.9 (calc) (ref <5.0)
Triglycerides: 98 mg/dL (ref <150)

## 2022-09-09 NOTE — Addendum Note (Signed)
Addended bySamuel Bouche on: 09/09/2022 12:27 PM   Modules accepted: Orders

## 2022-10-24 IMAGING — DX DG CHEST 2V
2 series · 2 of 2 positions shown · non-contrast
Comparison: Chest radiographs 10/31/2019 and earlier.

CLINICAL DATA: 60-year-old male with cough for 2.5 weeks.

EXAM:
CHEST - 2 VIEW

[chest pa]
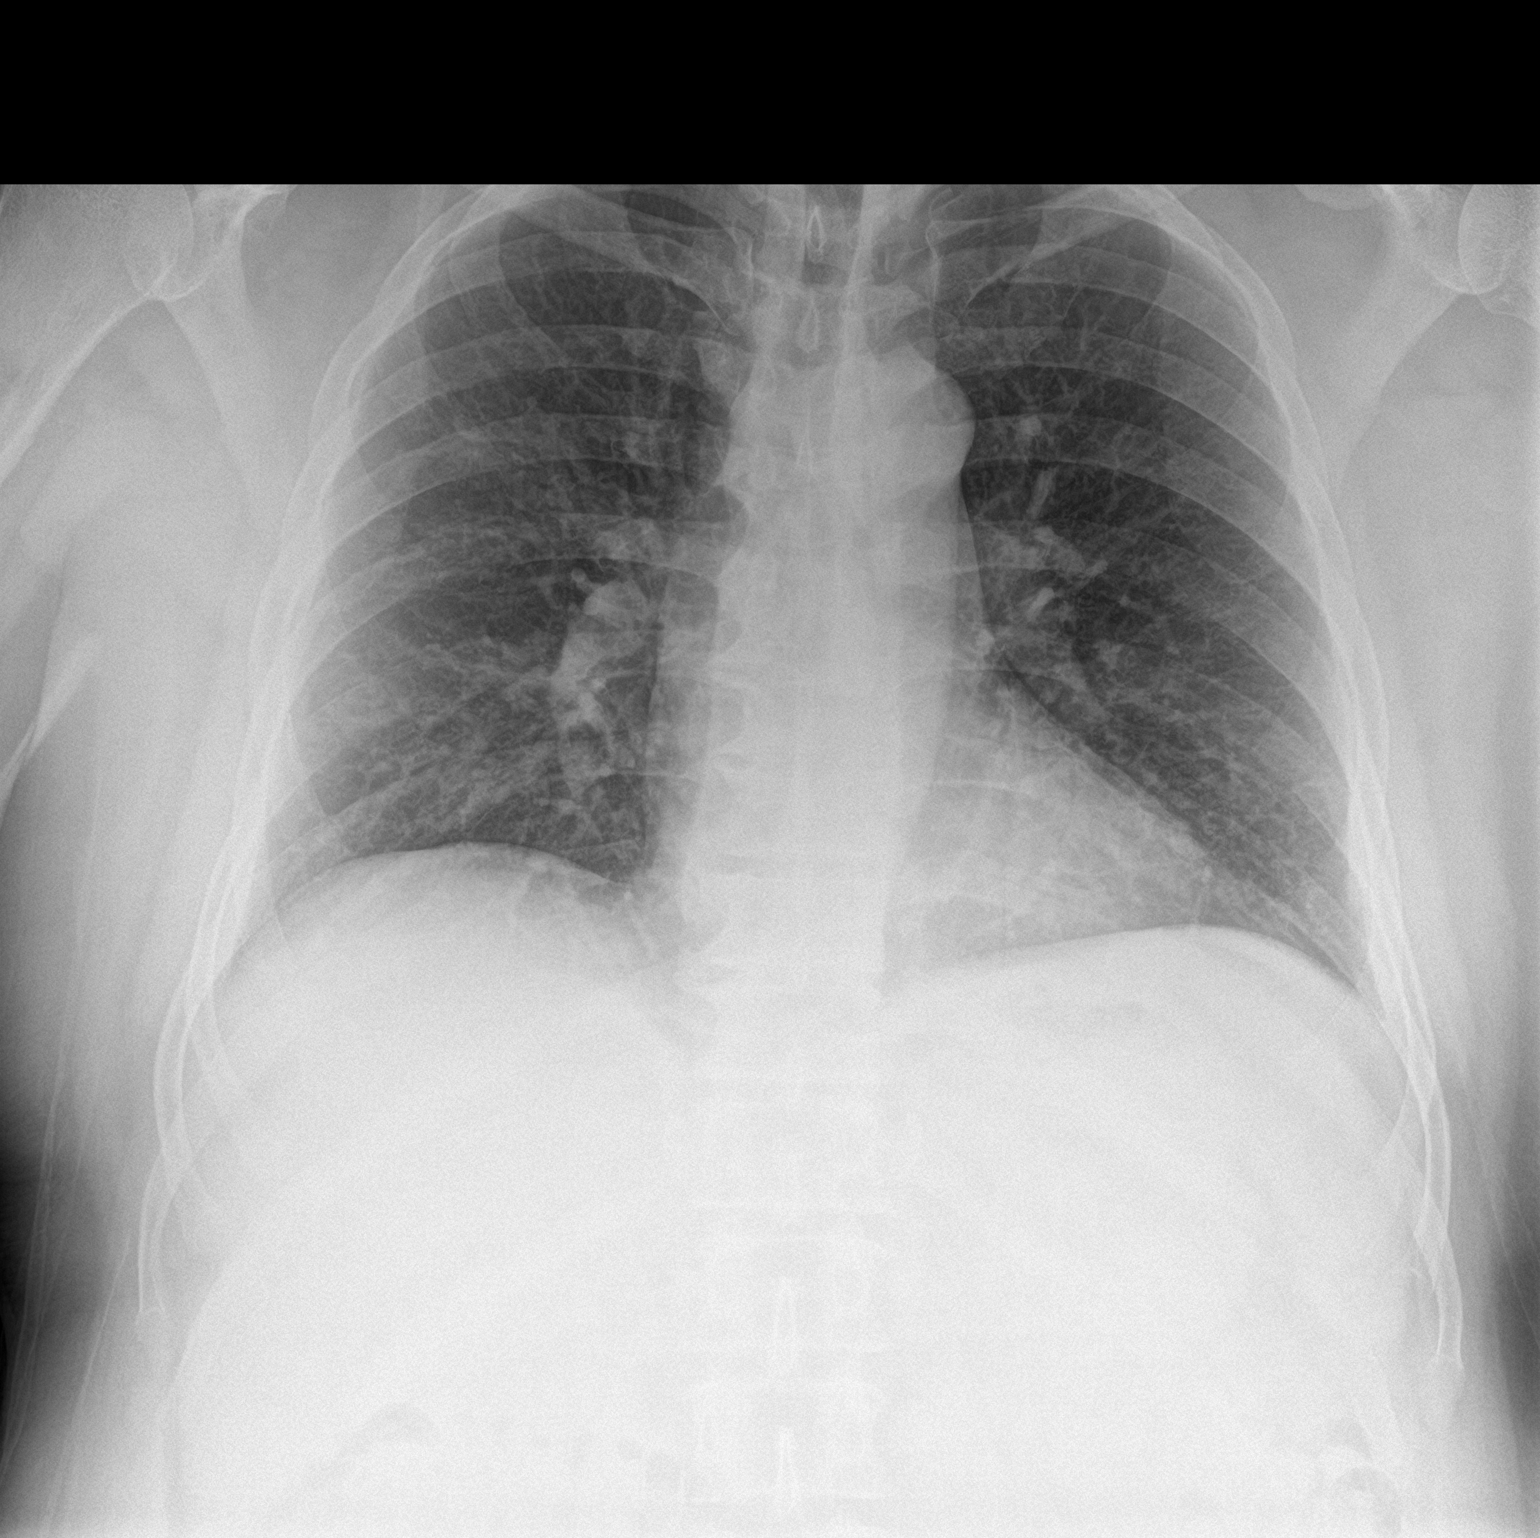

[chest lat]
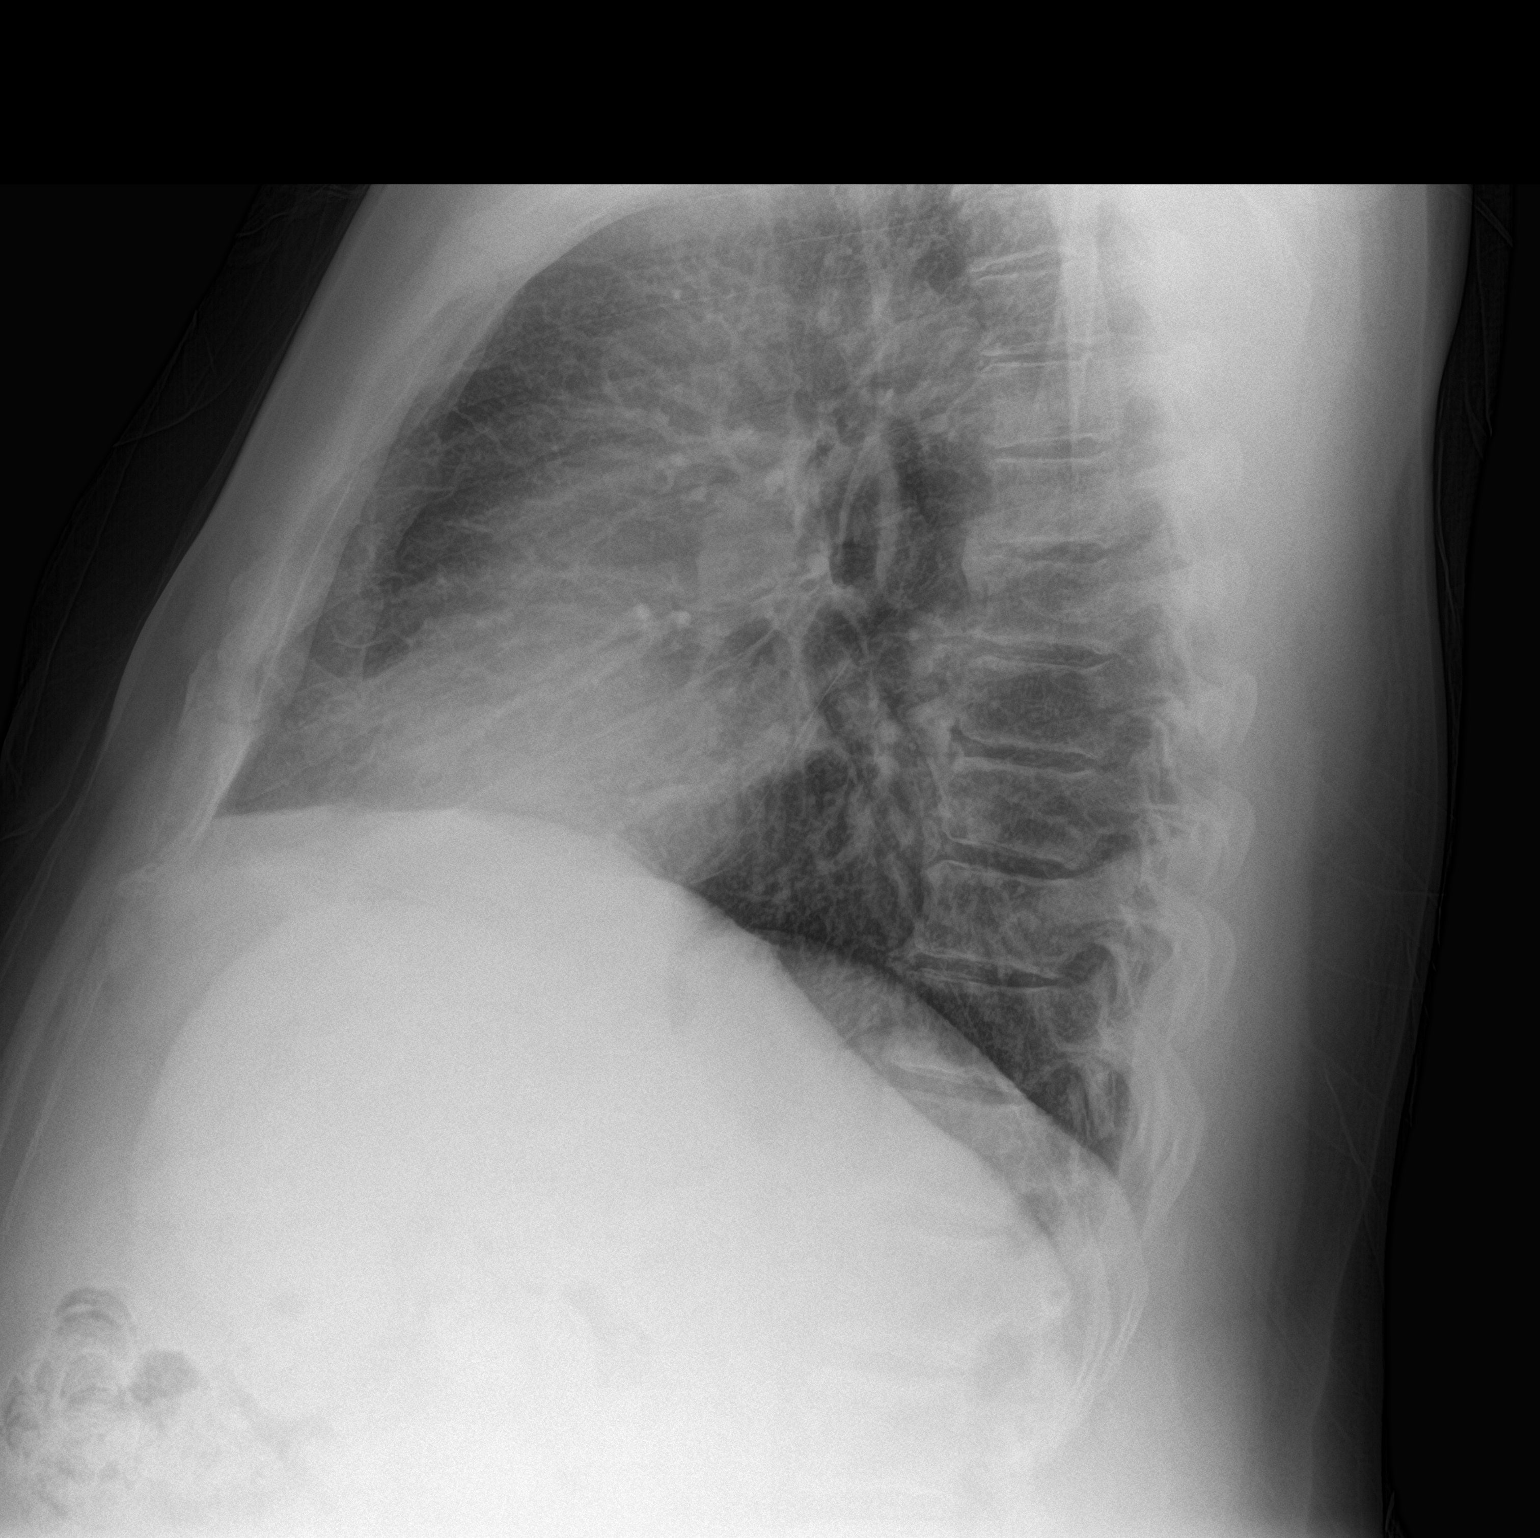

[2 of 2 positions shown; findings below may reference images not displayed]

FINDINGS: Mildly lower lung volumes today. Mediastinal contours remain normal.
Visualized tracheal air column is within normal limits. Mild diffuse
increased interstitial lung markings are stable since 4519. No
pneumothorax, pulmonary edema, pleural effusion or confluent
pulmonary opacity.

No acute osseous abnormality identified. Negative visible bowel gas
pattern.
IMPRESSION: No acute cardiopulmonary abnormality. Mild chronic nonspecific
pulmonary interstitial changes.

## 2023-03-09 ENCOUNTER — Encounter: Payer: Self-pay | Admitting: Medical-Surgical

## 2023-03-09 ENCOUNTER — Ambulatory Visit (INDEPENDENT_AMBULATORY_CARE_PROVIDER_SITE_OTHER): Payer: Self-pay | Admitting: Medical-Surgical

## 2023-03-09 VITALS — BP 117/76 | HR 67 | Ht 71.0 in | Wt 236.0 lb

## 2023-03-09 DIAGNOSIS — E118 Type 2 diabetes mellitus with unspecified complications: Secondary | ICD-10-CM

## 2023-03-09 DIAGNOSIS — E785 Hyperlipidemia, unspecified: Secondary | ICD-10-CM

## 2023-03-09 DIAGNOSIS — E1169 Type 2 diabetes mellitus with other specified complication: Secondary | ICD-10-CM

## 2023-03-09 DIAGNOSIS — I10 Essential (primary) hypertension: Secondary | ICD-10-CM

## 2023-03-09 LAB — POCT GLYCOSYLATED HEMOGLOBIN (HGB A1C): Hemoglobin A1C: 5.6 % (ref 4.0–5.6)

## 2023-03-09 LAB — POCT UA - MICROALBUMIN
Albumin/Creatinine Ratio, Urine, POC: <30
Creatinine, POC: 200 mg/dL
Microalbumin Ur, POC: 30 mg/L

## 2023-03-09 NOTE — Progress Notes (Signed)
        Established patient visit  History, exam, impression, and plan:  1. Controlled type 2 diabetes mellitus with complication, without long-term current use of insulin [facility]) Very pleasant 62 year old male presenting today with a history of type 2 diabetes.  Not currently taking any medications for diabetes management.  Has made multiple changes over the last 1-1.5 years and his diet and activity level.  Previous POCT hemoglobin A1c of 5.7%.  Recheck today at 5.6% showing excellent control with diet/lifestyle modifications.  POCT microalbumin normal today. - POCT HgB A1C - POCT UA - Microalbumin  2. Hyperlipidemia associated with type 2 diabetes mellitus (HCC) History of hyperlipidemia in the setting of type 2 diabetes however he is not currently on any medications.  We did discuss recommendations for statin therapy due to the presence of diabetes.  He is hesitant to consider adding medication today and will continue working on a low-fat heart healthy diet, weight loss, and increased activity.  3. Essential hypertension He has a history of hypertension however is no longer on any medications for management.  Blood pressure is beautiful at 117/76 today.  Following a low-sodium diet and getting regular activity.  Denies concerning symptoms.  Cardiopulmonary exam reassuring.  Continue lifestyle/dietary modifications.  Procedures performed this visit: None.  Return in about 6 months (around 09/09/2023) for chronic disease follow up.  __________________________________ Thayer Ohm, DNP, APRN, FNP-BC Primary Care and Sports Medicine [facility] [place]

## 2023-04-18 ENCOUNTER — Encounter: Payer: Self-pay | Admitting: Emergency Medicine

## 2023-04-18 ENCOUNTER — Ambulatory Visit
Admission: EM | Admit: 2023-04-18 | Discharge: 2023-04-18 | Disposition: A | Discharge status: Home / Self Care | Payer: Self-pay | Attending: Family Medicine | Admitting: Family Medicine

## 2023-04-18 DIAGNOSIS — S39002A Unspecified injury of muscle, fascia and tendon of lower back, initial encounter: Secondary | ICD-10-CM

## 2023-04-18 MED ORDER — IBUPROFEN 800 MG PO TABS: 800.0000 mg | ORAL_TABLET | Freq: Three times a day (TID) | ORAL | 0 refills | Status: DC

## 2023-04-18 MED ORDER — TIZANIDINE HCL 4 MG PO TABS: 4.0000 mg | ORAL_TABLET | Freq: Four times a day (QID) | ORAL | 0 refills | Status: DC | PRN

## 2023-04-18 NOTE — Discharge Instructions (Addendum)
Take ibuprofen 3 x a day with food Take muscle relaxer as needed, it is helpful at bedtime Use ice or heat
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
## 2023-04-18 NOTE — ED Provider Notes (Signed)
[name]    CSN: [id number] Arrival date & time: 04/18/23  1038      History   Chief Complaint Chief Complaint  Patient presents with   Back Pain    HPI Frank Mcclain is a 62 y.o. male.   Seen with Spanish interpreter Patient states that he was at work a couple of days ago on a piece of sheet rock slid off a pile and hit him in the back.  It was the edge of the sheet rock.  It struck him right in the left kidney area.  He has had pain in this area ever since then.  The pain is making it hard to sleep.  It is worse with movement.  No radiation.  No blood in urine.  It does hurt worse with a sneeze or deep breath.  No radiation to arms or legs.    Past Medical History:  Diagnosis Date   Diabetes mellitus without complication (HCC)    Hypertension    Varicose veins of left leg with edema     Patient Active Problem List   Diagnosis Date Noted   Hyperlipidemia associated with type 2 diabetes mellitus (HCC) 09/08/2022   Grief 09/20/2021   Uncontrolled type 2 diabetes mellitus with hyperglycemia (HCC) 11/24/2019   Essential hypertension 11/24/2019   Varicose veins of left lower extremity with pain 11/24/2019   Chronic pain disorder 11/26/2018   Lumbar degenerative disc disease 11/26/2018   Primary osteoarthritis of right knee 11/26/2018   Lumbar foraminal stenosis 11/26/2018    Past Surgical History:  Procedure Laterality Date   KNEE SURGERY     VARICOSE VEIN SURGERY         Home Medications    Prior to Admission medications   Medication Sig Start Date End Date Taking? Authorizing Provider  ibuprofen (ADVIL) 800 MG tablet Take 1 tablet (800 mg total) by mouth 3 (three) times daily. As needed for pain 04/18/23  Yes Eustace Moore, MD  tiZANidine (ZANAFLEX) 4 MG tablet Take 1-2 tablets (4-8 mg total) by mouth every 6 (six) hours as needed for muscle spasms. 04/18/23  Yes Eustace Moore, MD    Family History Family History  Family history  unknown: Yes    Social History Social History   Tobacco Use   Smoking status: Never   Smokeless tobacco: Never  Vaping Use   Vaping status: Never Used  Substance Use Topics   Alcohol use: Yes    Alcohol/week: 21.0 - 42.0 standard drinks of alcohol    Types: 21 - 42 Cans of beer per week    Comment: 3-6 beers/day   Drug use: No     Allergies   Patient has no known allergies.   Review of Systems Review of Systems See HPI  Physical Exam Triage Vital Signs ED Triage Vitals  Encounter Vitals Group     BP 04/18/23 1056 (!) 134/91     Systolic BP Percentile --      Diastolic BP Percentile --      Pulse Rate 04/18/23 1056 63     Resp 04/18/23 1056 18     Temp 04/18/23 1056 97.8 F (36.6 C)     Temp Source 04/18/23 1056 Oral     SpO2 04/18/23 1056 95 %     Weight 04/18/23 1058 245 lb (111.1 kg)     Height 04/18/23 1058 5\' 11"  (1.803 m)     Head Circumference --      Peak  Flow --      Pain Score 04/18/23 1058 9     Pain Loc --      Pain Education --      Exclude from Growth Chart --    No data found.  Updated Vital Signs BP (!) 134/91   Pulse 63   Temp 97.8 F (36.6 C) (Oral)   Resp 18   Ht 5\' 11"  (1.803 m)   Wt 111.1 kg   SpO2 95%   BMI 34.17 kg/m       Physical Exam Constitutional:      General: He is not in acute distress.    Appearance: He is well-developed.  HENT:     Head: Normocephalic and atraumatic.  Eyes:     Conjunctiva/sclera: Conjunctivae normal.     Pupils: Pupils are equal, round, and reactive to light.  Cardiovascular:     Rate and Rhythm: Normal rate.  Pulmonary:     Effort: Pulmonary effort is normal. No respiratory distress.  Abdominal:     General: There is no distension.     Palpations: Abdomen is soft.  Musculoskeletal:        General: Tenderness present. Normal range of motion.     Cervical back: Normal range of motion.     Comments: Tenderness just below the rib cage on the left side.  Tight tender lumbar muscles.  No  bony tenderness.  Lungs are clear.  No rib tenderness.  Skin:    General: Skin is warm and dry.  Neurological:     Mental Status: He is alert.      UC Treatments / Results  Labs (all labs ordered are listed, but only abnormal results are displayed) Labs Reviewed - No data to display  EKG   Radiology No results found.  Procedures Procedures (including critical care time)  Medications Ordered in UC Medications - No data to display  Initial Impression / Assessment and Plan / UC Course  I have reviewed the triage vital signs and the nursing notes.  Pertinent labs & imaging results that were available during my care of the patient were reviewed by me and considered in my medical decision making (see chart for details).     Muscle contusion Final Clinical Impressions(s) / UC Diagnoses   Final diagnoses:  Injury of muscle of lower back     Discharge Instructions      Take ibuprofen 3 x a day with food Take muscle relaxer as needed, it is helpful at bedtime Use ice or heat    ED Prescriptions     Medication Sig Dispense Auth. Provider   tiZANidine (ZANAFLEX) 4 MG tablet Take 1-2 tablets (4-8 mg total) by mouth every 6 (six) hours as needed for muscle spasms. 21 tablet Eustace Moore, MD   ibuprofen (ADVIL) 800 MG tablet Take 1 tablet (800 mg total) by mouth 3 (three) times daily. As needed for pain 21 tablet Eustace Moore, MD      PDMP not reviewed this encounter.   Eustace Moore, MD 04/18/23 [phone]

## 2023-08-25 ENCOUNTER — Other Ambulatory Visit: Payer: Self-pay

## 2023-08-25 ENCOUNTER — Ambulatory Visit
Admission: EM | Admit: 2023-08-25 | Discharge: 2023-08-25 | Disposition: A | Discharge status: Home / Self Care | Payer: Self-pay

## 2023-08-25 DIAGNOSIS — J101 Influenza due to other identified influenza virus with other respiratory manifestations: Secondary | ICD-10-CM

## 2023-08-25 DIAGNOSIS — R059 Cough, unspecified: Secondary | ICD-10-CM

## 2023-08-25 LAB — POC SARS CORONAVIRUS 2 AG -  ED: SARS Coronavirus 2 Ag: NEGATIVE

## 2023-08-25 LAB — POCT INFLUENZA A/B
Influenza A, POC: POSITIVE — AB
Influenza B, POC: NEGATIVE

## 2023-08-25 MED ORDER — OSELTAMIVIR PHOSPHATE 75 MG PO CAPS: 75.0000 mg | ORAL_CAPSULE | Freq: Two times a day (BID) | ORAL | 0 refills | Status: DC

## 2023-08-25 MED ORDER — BENZONATATE 200 MG PO CAPS: 200.0000 mg | ORAL_CAPSULE | Freq: Three times a day (TID) | ORAL | 0 refills | Status: DC | PRN

## 2023-08-25 MED ORDER — PREDNISONE 20 MG PO TABS: ORAL_TABLET | ORAL | 0 refills | Status: DC

## 2023-08-25 MED ORDER — PROMETHAZINE-DM 6.25-15 MG/5ML PO SYRP: 5.0000 mL | ORAL_SOLUTION | Freq: Two times a day (BID) | ORAL | 0 refills | Status: DC | PRN

## 2023-08-25 NOTE — Discharge Instructions (Addendum)
Advised patient to take medications as directed with food to completion.  Advised patient to take prednisone with first dose of Tamiflu for the next 5 days.  Advised may take Tessalon capsules daily or as needed for cough.  Advised may use Promethazine DM at night for cough due to sedative effects.  Advised patient may take OTC Tylenol 1 g every 6 hours for fever (oral temperature greater than 100.3).  Encouraged increase daily water intake to 64 ounces per day while taking these medications.  Advised if symptoms worsen and/or unresolved please follow-up PCP or here for further evaluation.

## 2023-08-25 NOTE — ED Triage Notes (Signed)
Pt presents to [facility] with co of body aches, ha, otalgia, sore throat, cough and fevers since Sunday. 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 Has been taking allergia and tylenol.

## 2023-08-25 NOTE — ED Provider Notes (Signed)
[name]    CSN: [id number] Arrival date & time: 08/25/23  0840      History   Chief Complaint Chief Complaint  Patient presents with   URI    HPI Frank Mcclain is a 63 y.o. male.   HPI 63 year old male presents with influenza-like symptoms patient reports body aches, headaches, otalgia, sore throat, cough, fever since Sunday.  PMH significant for obesity, T2DM without complication and HTN.  Past Medical History:  Diagnosis Date   Diabetes mellitus without complication (HCC)    Hypertension    Varicose veins of left leg with edema     Patient Active Problem List   Diagnosis Date Noted   Hyperlipidemia associated with type 2 diabetes mellitus (HCC) 09/08/2022   Grief 09/20/2021   Uncontrolled type 2 diabetes mellitus with hyperglycemia (HCC) 11/24/2019   Essential hypertension 11/24/2019   Varicose veins of left lower extremity with pain 11/24/2019   Chronic pain disorder 11/26/2018   Lumbar degenerative disc disease 11/26/2018   Primary osteoarthritis of right knee 11/26/2018   Lumbar foraminal stenosis 11/26/2018    Past Surgical History:  Procedure Laterality Date   KNEE SURGERY     VARICOSE VEIN SURGERY         Home Medications    Prior to Admission medications   Medication Sig Start Date End Date Taking? Authorizing Provider  benzonatate (TESSALON) 200 MG capsule Take 1 capsule (200 mg total) by mouth 3 (three) times daily as needed for up to 7 days. 08/25/23 09/01/23 Yes Trevor Iha, FNP  brimonidine (ALPHAGAN) 0.2 % ophthalmic solution Place 1 drop into the left eye 3 (three) times daily. 08/07/23  Yes [provider]  neomycin-polymyxin b-dexamethasone (MAXITROL) 3.5-10000-0.1 OINT SMARTSIG:1 Inch(es) In Eye(s) Every Night 06/27/23  Yes [provider]  oseltamivir (TAMIFLU) 75 MG capsule Take 1 capsule (75 mg total) by mouth every 12 (twelve) hours. 08/25/23  Yes Trevor Iha, FNP  predniSONE (DELTASONE) 20 MG  tablet Take 3 tabs PO daily x 5 days. 08/25/23  Yes Trevor Iha, FNP  promethazine-dextromethorphan (PROMETHAZINE-DM) 6.25-15 MG/5ML syrup Take 5 mLs by mouth 2 (two) times daily as needed for cough. 08/25/23  Yes Trevor Iha, FNP  tobramycin-dexamethasone [facility]) ophthalmic solution Place 1 drop into the left eye 3 (three) times daily. 06/29/23  Yes [provider]    Family History Family History  Problem Relation Age of Onset   Healthy Mother    Healthy Father     Social History Social History   Tobacco Use   Smoking status: Never   Smokeless tobacco: Never  Vaping Use   Vaping status: Never Used  Substance Use Topics   Alcohol use: Yes    Alcohol/week: 21.0 - 42.0 standard drinks of alcohol    Types: 21 - 42 Cans of beer per week    Comment: 3-6 beers/day   Drug use: No     Allergies   Patient has no known allergies.   Review of Systems Review of Systems  HENT:  Positive for congestion and ear pain.   Musculoskeletal:  Positive for arthralgias and myalgias.  Neurological:  Positive for headaches.  All other systems reviewed and are negative.    Physical Exam Triage Vital Signs ED Triage Vitals  Encounter Vitals Group     BP 08/25/23 0851 139/85     Systolic BP Percentile --      Diastolic BP Percentile --      Pulse Rate 08/25/23 0851 85  Resp 08/25/23 0851 16     Temp 08/25/23 0851 99.5 F (37.5 C)     Temp src --      SpO2 08/25/23 0851 98 %     Weight --      Height --      Head Circumference --      Peak Flow --      Pain Score 08/25/23 0850 9     Pain Loc --      Pain Education --      Exclude from Growth Chart --    No data found.  Updated Vital Signs BP 139/85   Pulse 85   Temp 99.5 F (37.5 C)   Resp 16   SpO2 98%    Physical Exam Vitals and nursing note reviewed.  Constitutional:      Appearance: Normal appearance. He is obese. He is ill-appearing.  HENT:     Head: Normocephalic and atraumatic.     Right  Ear: Tympanic membrane, ear canal and external ear normal.     Left Ear: Tympanic membrane, ear canal and external ear normal.     Nose: Nose normal.     Mouth/Throat:     Mouth: Mucous membranes are dry.     Pharynx: Oropharynx is clear.  Eyes:     Extraocular Movements: Extraocular movements intact.     Conjunctiva/sclera: Conjunctivae normal.     Pupils: Pupils are equal, round, and reactive to light.  Cardiovascular:     Rate and Rhythm: Normal rate and regular rhythm.     Pulses: Normal pulses.     Heart sounds: Normal heart sounds.  Pulmonary:     Effort: Pulmonary effort is normal.     Breath sounds: Normal breath sounds. No wheezing, rhonchi or rales.  Musculoskeletal:        General: Normal range of motion.     Cervical back: Normal range of motion and neck supple.  Skin:    General: Skin is warm and dry.  Neurological:     General: No focal deficit present.     Mental Status: He is alert and oriented to person, place, and time. Mental status is at baseline.  Psychiatric:        Mood and Affect: Mood normal.        Behavior: Behavior normal.      UC Treatments / Results  Labs (all labs ordered are listed, but only abnormal results are displayed) Labs Reviewed  POCT INFLUENZA A/B - Abnormal; Notable for the following components:      Result Value   Influenza A, POC Positive (*)    All other components within normal limits  POC SARS CORONAVIRUS 2 AG -  ED    EKG   Radiology No results found.  Procedures Procedures (including critical care time)  Medications Ordered in UC Medications - No data to display  Initial Impression / Assessment and Plan / UC Course  I have reviewed the triage vital signs and the nursing notes.  Pertinent labs & imaging results that were available during my care of the patient were reviewed by me and considered in my medical decision making (see chart for details).     MDM: 1.  Influenza A-Rx'd Tamiflu 75 mg capsule: Take 1  capsule twice daily x 5 days; 2.  Cough-Rx'd prednisone 20 mg tablet: Take 3 tabs p.o. daily x 5 days, Rx'd Tessalon 200 mg capsule: Take 1 capsule 3 times daily, as needed, Rx'd promethazine  DM 6.25-15 Mg/5 mL syrup: Take 5 mL every 6 hours as needed for cough. Advised patient to take medications as directed with food to completion.  Advised patient to take prednisone with first dose of Tamiflu for the next 5 days.  Advised may take Tessalon capsules daily or as needed for cough.  Advised may use Promethazine DM at night for cough due to sedative effects.  Advised patient may take OTC Tylenol 1 g every 6 hours for fever (oral temperature greater than 100.3).  Encouraged increase daily water intake to 64 ounces per day while taking these medications.  Advised if symptoms worsen and/or unresolved please follow-up PCP or here for further evaluation.  Patient discharged home, hemodynamically stable. Final Clinical Impressions(s) / UC Diagnoses   Final diagnoses:  Influenza A  Cough, unspecified type     Discharge Instructions      Advised patient to take medications as directed with food to completion.  Advised patient to take prednisone with first dose of Tamiflu for the next 5 days.  Advised may take Tessalon capsules daily or as needed for cough.  Advised may use Promethazine DM at night for cough due to sedative effects.  Advised patient may take OTC Tylenol 1 g every 6 hours for fever (oral temperature greater than 100.3).  Encouraged increase daily water intake to 64 ounces per day while taking these medications.  Advised if symptoms worsen and/or unresolved please follow-up PCP or here for further evaluation.     ED Prescriptions     Medication Sig Dispense Auth. Provider   oseltamivir (TAMIFLU) 75 MG capsule Take 1 capsule (75 mg total) by mouth every 12 (twelve) hours. 10 capsule Trevor Iha, FNP   predniSONE (DELTASONE) 20 MG tablet Take 3 tabs PO daily x 5 days. 15 tablet Trevor Iha, FNP   benzonatate (TESSALON) 200 MG capsule Take 1 capsule (200 mg total) by mouth 3 (three) times daily as needed for up to 7 days. 40 capsule Trevor Iha, FNP   promethazine-dextromethorphan (PROMETHAZINE-DM) 6.25-15 MG/5ML syrup Take 5 mLs by mouth 2 (two) times daily as needed for cough. 118 mL Trevor Iha, FNP      PDMP not reviewed this encounter.   Trevor Iha, FNP 08/25/23 [phone]

## 2023-09-01 ENCOUNTER — Ambulatory Visit
Admission: EM | Admit: 2023-09-01 | Discharge: 2023-09-01 | Disposition: A | Discharge status: Home / Self Care | Payer: Self-pay | Attending: Family Medicine | Admitting: Family Medicine

## 2023-09-01 DIAGNOSIS — J019 Acute sinusitis, unspecified: Secondary | ICD-10-CM

## 2023-09-01 MED ORDER — GUAIFENESIN-CODEINE 100-10 MG/5ML PO SOLN: 5.0000 mL | Freq: Four times a day (QID) | ORAL | 0 refills | Status: AC | PRN

## 2023-09-01 MED ORDER — DOXYCYCLINE HYCLATE 100 MG PO CAPS: 100.0000 mg | ORAL_CAPSULE | Freq: Two times a day (BID) | ORAL | 0 refills | Status: AC

## 2023-09-01 NOTE — ED Provider Notes (Signed)
[facility]    CSN: [id number] Arrival date & time: 09/01/23  1814      History   Chief Complaint Chief Complaint  Patient presents with   URI    HPI Frank Mcclain is a 63 y.o. male.    URI Here for continued cough and congestion and nasal drainage.  He got sick about 9 to 10 days ago.  He was seen here on January 21 and tested positive for the flu.  He is taking his Tamiflu.  He continues to have a lot of nasal congestion and sinus congestion and pressure and cough.  He states also his anterior chest hurts at night especially and hurts when he coughs.  He is also having some pressure behind his left eye.  NKDA  Past medical history is significant for diabetes, and he states his diabetes has been doing "okay"  Past Medical History:  Diagnosis Date   Diabetes mellitus without complication (HCC)    Hypertension    Varicose veins of left leg with edema     Patient Active Problem List   Diagnosis Date Noted   Hyperlipidemia associated with type 2 diabetes mellitus (HCC) 09/08/2022   Grief 09/20/2021   Uncontrolled type 2 diabetes mellitus with hyperglycemia (HCC) 11/24/2019   Essential hypertension 11/24/2019   Varicose veins of left lower extremity with pain 11/24/2019   Chronic pain disorder 11/26/2018   Lumbar degenerative disc disease 11/26/2018   Primary osteoarthritis of right knee 11/26/2018   Lumbar foraminal stenosis 11/26/2018    Past Surgical History:  Procedure Laterality Date   KNEE SURGERY     VARICOSE VEIN SURGERY         Home Medications    Prior to Admission medications   Medication Sig Start Date End Date Taking? Authorizing Provider  doxycycline (VIBRAMYCIN) 100 MG capsule Take 1 capsule (100 mg total) by mouth 2 (two) times daily for 7 days. 09/01/23 09/08/23 Yes Jeani Fassnacht, Janace Aris, MD  guaiFENesin-codeine 100-10 MG/5ML syrup Take 5 mLs by mouth every 6 (six) hours as needed for cough. 09/01/23  Yes Zenia Resides, MD   brimonidine (ALPHAGAN) 0.2 % ophthalmic solution Place 1 drop into the left eye 3 (three) times daily. 08/07/23   [provider]  neomycin-polymyxin b-dexamethasone (MAXITROL) 3.5-10000-0.1 OINT SMARTSIG:1 Inch(es) In Eye(s) Every Night 06/27/23   [provider]  tobramycin-dexamethasone (TOBRADEX) ophthalmic solution Place 1 drop into the left eye 3 (three) times daily. 06/29/23   [provider]    Family History Family History  Problem Relation Age of Onset   Healthy Mother    Healthy Father     Social History Social History   Tobacco Use   Smoking status: Never   Smokeless tobacco: Never  Vaping Use   Vaping status: Never Used  Substance Use Topics   Alcohol use: Yes    Alcohol/week: 21.0 - 42.0 standard drinks of alcohol    Types: 21 - 42 Cans of beer per week    Comment: 3-6 beers/day   Drug use: No     Allergies   Patient has no known allergies.   Review of Systems Review of Systems   Physical Exam Triage Vital Signs ED Triage Vitals [09/01/23 1834]  Encounter Vitals Group     BP 130/81     Systolic BP Percentile      Diastolic BP Percentile      Pulse Rate 70     Resp 16     Temp  98.6 F (37 C)     Temp src      SpO2 98 %     Weight      Height      Head Circumference      Peak Flow      Pain Score 10     Pain Loc      Pain Education      Exclude from Growth Chart    No data found.  Updated Vital Signs BP 130/81   Pulse 70   Temp 98.6 F (37 C)   Resp 16   SpO2 98%   Visual Acuity Right Eye Distance:   Left Eye Distance:   Bilateral Distance:    Right Eye Near:   Left Eye Near:    Bilateral Near:     Physical Exam Vitals reviewed.  Constitutional:      General: He is not in acute distress.    Appearance: He is not ill-appearing, toxic-appearing or diaphoretic.  HENT:     Right Ear: Tympanic membrane and ear canal normal.     Left Ear: Tympanic membrane and ear canal normal.     Nose: Congestion  present.     Mouth/Throat:     Mouth: Mucous membranes are moist.     Comments: There is white mucus draining in the oropharynx Eyes:     Extraocular Movements: Extraocular movements intact.     Conjunctiva/sclera: Conjunctivae normal.     Pupils: Pupils are equal, round, and reactive to light.  Cardiovascular:     Rate and Rhythm: Normal rate and regular rhythm.     Heart sounds: No murmur heard. Pulmonary:     Effort: No respiratory distress.     Breath sounds: Normal breath sounds. No stridor. No wheezing, rhonchi or rales.     Comments: He is mildly tender over the costosternal junctions. Musculoskeletal:     Cervical back: Neck supple.  Lymphadenopathy:     Cervical: No cervical adenopathy.  Skin:    Capillary Refill: Capillary refill takes less than 2 seconds.     Coloration: Skin is not jaundiced or pale.  Neurological:     General: No focal deficit present.     Mental Status: He is alert and oriented to person, place, and time.  Psychiatric:        Behavior: Behavior normal.      UC Treatments / Results  Labs (all labs ordered are listed, but only abnormal results are displayed) Labs Reviewed - No data to display  EKG   Radiology No results found.  Procedures Procedures (including critical care time)  Medications Ordered in UC Medications - No data to display  Initial Impression / Assessment and Plan / UC Course  I have reviewed the triage vital signs and the nursing notes.  Pertinent labs & imaging results that were available during my care of the patient were reviewed by me and considered in my medical decision making (see chart for details).   Doxycycline is sent in for what appears to be an acute sinusitis.  Tessalon Perles are sent in for cough. Final Clinical Impressions(s) / UC Diagnoses   Final diagnoses:  Acute sinusitis, recurrence not specified, unspecified location     Discharge Instructions      Take doxycycline 100 mg --1 capsule 2  times daily for 7 days; this is the antibiotic for possible sinus infection. (Tome doxiciclina 100 mg: 1 cpsula 2 veces al da durante 7 [address]  [address]; este es el antibitico  para una posible infeccin de los senos nasales)  Robitussin with codeine cough syrup--take 5 mL or 1 teaspoon every 6 hours as needed for cough. (Robitussin con jarabe para la tos con codena: tome 5 ml o 1 cucharadita cada 6 horas segn sea necesario para la tos.)       ED Prescriptions     Medication Sig Dispense Auth. Provider   guaiFENesin-codeine 100-10 MG/5ML syrup Take 5 mLs by mouth every 6 (six) hours as needed for cough. 120 mL Zenia Resides, MD   doxycycline (VIBRAMYCIN) 100 MG capsule Take 1 capsule (100 mg total) by mouth 2 (two) times daily for 7 days. 14 capsule Gertie Broerman, Janace Aris, MD      I have reviewed the PDMP during this encounter.   Zenia Resides, MD 09/01/23 [phone]

## 2023-09-01 NOTE — ED Triage Notes (Signed)
Pt presents to [facility] with co of cough. Pt was seen here 1 week ago and diagnosed with flu. Pt reports he finished medications and still has bad cough congestion and new onset of eye pain on left eye.

## 2023-09-01 NOTE — Discharge Instructions (Addendum)
Take doxycycline 100 mg --1 capsule 2 times daily for 7 days; this is the antibiotic for possible sinus infection. (Tome doxiciclina 100 mg: 1 cpsula 2 veces al da durante 7 [address]  [address]; este es el antibitico para una posible infeccin de los senos nasales)  Robitussin with codeine cough syrup--take 5 mL or 1 teaspoon every 6 hours as needed for cough. (Robitussin con jarabe para la tos con codena: tome 5 ml o 1 cucharadita cada 6 horas segn sea necesario para la tos.)

## 2023-09-09 ENCOUNTER — Ambulatory Visit (INDEPENDENT_AMBULATORY_CARE_PROVIDER_SITE_OTHER): Payer: Self-pay | Admitting: Medical-Surgical

## 2023-09-09 ENCOUNTER — Encounter: Payer: Self-pay | Admitting: Medical-Surgical

## 2023-09-09 ENCOUNTER — Ambulatory Visit: Payer: Self-pay

## 2023-09-09 VITALS — BP 111/75 | HR 75 | Resp 20 | Ht 71.0 in | Wt 238.1 lb

## 2023-09-09 DIAGNOSIS — E1169 Type 2 diabetes mellitus with other specified complication: Secondary | ICD-10-CM

## 2023-09-09 DIAGNOSIS — E119 Type 2 diabetes mellitus without complications: Secondary | ICD-10-CM

## 2023-09-09 DIAGNOSIS — I10 Essential (primary) hypertension: Secondary | ICD-10-CM

## 2023-09-09 DIAGNOSIS — R051 Acute cough: Secondary | ICD-10-CM

## 2023-09-09 DIAGNOSIS — E785 Hyperlipidemia, unspecified: Secondary | ICD-10-CM

## 2023-09-09 DIAGNOSIS — R059 Cough, unspecified: Secondary | ICD-10-CM

## 2023-09-09 DIAGNOSIS — R0789 Other chest pain: Secondary | ICD-10-CM

## 2023-09-09 DIAGNOSIS — R918 Other nonspecific abnormal finding of lung field: Secondary | ICD-10-CM

## 2023-09-09 LAB — POCT GLYCOSYLATED HEMOGLOBIN (HGB A1C)
HbA1c, POC (controlled diabetic range): 5.5 % (ref 0.0–7.0)
Hemoglobin A1C: 5.5 % (ref 4.0–5.6)

## 2023-09-09 NOTE — Progress Notes (Signed)
        Established patient visit  History, exam, impression, and plan:  1. Essential hypertension (Primary) Pleasant 63 year old male presenting for follow up on HTN. He is not currently on any prescribed medications. Does not regularly check blood pressures. Endorses following a low sodium diet. Does have some lower extremity edema in the setting of severe varicose veins but denies other concerning symptoms. Lungs clear but diminished throughout (see below). Respirations mildly tachypneic but unlabored. S1/S2 normal. HR regular with intermittent PVCs/PACs. Checking labs today. BP at goal. Continue diet/lifestyle control.  - CBC - CMP14+EGFR - Lipid panel  2. Hyperlipidemia associated with type 2 diabetes mellitus (HCC) History of HLD in the setting of diabetes. No longer on statin medication despite recommendations. Declined statin restart. Plan to check labs today. Recommend a low fat, heart healthy diet.  - CMP14+EGFR - Lipid panel  3. Controlled type 2 diabetes mellitus without complication, without long-term current use of insulin (HCC) Not on any antidiabetic medications. Does not check sugars at home. Stays active but no physical exercise. A1c checked today at 5.5% indicating excellent control. Continue diabetic diet while working on regular intentional exercise with a goal for weight loss to a healthy weight.  - POCT HgB A1C  4. Acute cough Was seen in UC on 1/21 where he was diagnosed with Flu A. He was treated with Tamiflu , Prednisione, Promethazine -DM, and Tessalon  perls. Completed the course but his symptoms did not improve. He was seen at [facility] again on 1/28 where he was treated with Doxycycline  BID x 7 days and given Robitussin AC. He took his last Doxycycline  dose last night but has not noted much improvement. Continues to have a strong cough that is mostly not productive but does have yellowish green nasal discharge and postnasal drip. Gets short of breath during coughing fits and  has trouble laying down fully at night. Exam overall benign with the exception of diminished lung sounds throughout. Cough strong, with audible congestion. Plan for chest x-ray today. Concern for pneumonia. Since Doxycycline  has not helped, plan to evaluate chest x-ray prior to adding antibiotics although Augmentin  will likely be the best option.  - DG Chest 2 View; Future   Procedures performed this visit: None.  Return in about 6 months (around 03/08/2024) for chronic disease follow up.  __________________________________ Frank FREDRIK Palin, DNP, APRN, FNP-BC Primary Care and Sports Medicine [facility] [place]

## 2023-09-10 LAB — CMP14+EGFR
ALT: 19 [IU]/L (ref 0–44)
AST: 19 [IU]/L (ref 0–40)
Albumin: 3.9 g/dL (ref 3.8–4.9)
Alkaline Phosphatase: 96 [IU]/L (ref 44–121)
BUN/Creatinine Ratio: 13 (ref 10–24)
BUN: 9 mg/dL (ref 8–27)
Bilirubin Total: 0.9 mg/dL (ref 0.0–1.2)
CO2: 20 mmol/L (ref 20–29)
Calcium: 9.2 mg/dL (ref 8.6–10.2)
Chloride: 108 mmol/L — ABNORMAL HIGH (ref 96–106)
Creatinine, Ser: 0.68 mg/dL — ABNORMAL LOW (ref 0.76–1.27)
Globulin, Total: 2.6 g/dL (ref 1.5–4.5)
Glucose: 158 mg/dL — ABNORMAL HIGH (ref 70–99)
Potassium: 3.9 mmol/L (ref 3.5–5.2)
Sodium: 143 mmol/L (ref 134–144)
Total Protein: 6.5 g/dL (ref 6.0–8.5)
eGFR: 106 mL/min/{1.73_m2} (ref >59)

## 2023-09-10 LAB — LIPID PANEL
Chol/HDL Ratio: 4.1 {ratio} (ref 0.0–5.0)
Cholesterol, Total: 171 mg/dL (ref 100–199)
HDL: 42 mg/dL (ref >39)
LDL Chol Calc (NIH): 109 mg/dL — ABNORMAL HIGH (ref 0–99)
Triglycerides: 109 mg/dL (ref 0–149)
VLDL Cholesterol Cal: 20 mg/dL (ref 5–40)

## 2023-09-10 LAB — CBC
Hematocrit: 49.9 % (ref 37.5–51.0)
Hemoglobin: 17.4 g/dL (ref 13.0–17.7)
MCH: 34.1 pg — ABNORMAL HIGH (ref 26.6–33.0)
MCHC: 34.9 g/dL (ref 31.5–35.7)
MCV: 98 fL — ABNORMAL HIGH (ref 79–97)
Platelets: 246 10*3/uL (ref 150–450)
RBC: 5.1 x10E6/uL (ref 4.14–5.80)
RDW: 12.2 % (ref 11.6–15.4)
WBC: 5.2 10*3/uL (ref 3.4–10.8)

## 2024-03-07 NOTE — Progress Notes (Unsigned)
        Established patient visit   History of Present Illness   Discussed the use of AI scribe software for clinical note transcription with the patient, who gave verbal consent to proceed.  History of Present Illness   Frank Mcclain is a 63 year old male with diabetes who presents for a follow-up visit.  He is not on any diabetes medications and monitors his blood glucose daily, with morning readings between 90 and 120 mg/dL. He has lost weight from 270 pounds to 238 pounds since early 2024. He had an eye exam two months ago and uses prescribed eye drops without current issues. His blood pressure is stable here and at home. He had satisfactory blood work in February. No significant problems or concerns today, including cramping or other symptoms.      Physical Exam   Physical Exam Vitals and nursing note reviewed.  Constitutional:      General: He is not in acute distress.    Appearance: Normal appearance. He is not ill-appearing.  HENT:     Head: Normocephalic and atraumatic.  Cardiovascular:     Rate and Rhythm: Normal rate and regular rhythm.     Pulses: Normal pulses.     Heart sounds: Normal heart sounds. No murmur heard.    No friction rub. No gallop.  Pulmonary:     Effort: Pulmonary effort is normal. No respiratory distress.     Breath sounds: Normal breath sounds.  Skin:    General: Skin is warm and dry.  Neurological:     Mental Status: He is alert and oriented to person, place, and time.  Psychiatric:        Mood and Affect: Mood normal.        Behavior: Behavior normal.        Thought Content: Thought content normal.        Judgment: Judgment normal.    Assessment & Plan   Assessment and Plan    Type 2 diabetes mellitus A1c 5.4%. Diet and lifestyle controlled. Daily glucose 90-120 mg/dL. - Monitor blood glucose daily. - Check POCT Microalbumin.  Overweight Significant weight loss from 270 to 238 pounds, aiding overall health and diabetes  management.  General Health Maintenance Due for pneumonia vaccination. Aware of upcoming flu vaccination. - Administer Prevnar 20 pneumonia vaccine today. - Advise to return for flu vaccination when available.      Follow up   Return in about 6 months (around 09/08/2024) for chronic disease follow up.  __________________________________ Zada FREDRIK Palin, DNP, APRN, FNP-BC Primary Care and Sports Medicine [facility] [place]

## 2024-03-08 ENCOUNTER — Encounter: Payer: Self-pay | Admitting: Medical-Surgical

## 2024-03-08 ENCOUNTER — Ambulatory Visit (INDEPENDENT_AMBULATORY_CARE_PROVIDER_SITE_OTHER): Payer: Self-pay | Admitting: Medical-Surgical

## 2024-03-08 VITALS — BP 130/82 | HR 68 | Resp 20 | Ht 71.0 in | Wt 245.0 lb

## 2024-03-08 DIAGNOSIS — E1169 Type 2 diabetes mellitus with other specified complication: Secondary | ICD-10-CM

## 2024-03-08 DIAGNOSIS — E785 Hyperlipidemia, unspecified: Secondary | ICD-10-CM

## 2024-03-08 DIAGNOSIS — Z23 Encounter for immunization: Secondary | ICD-10-CM

## 2024-03-08 DIAGNOSIS — E119 Type 2 diabetes mellitus without complications: Secondary | ICD-10-CM

## 2024-03-08 DIAGNOSIS — I10 Essential (primary) hypertension: Secondary | ICD-10-CM

## 2024-03-08 LAB — POCT UA - MICROALBUMIN
Albumin/Creatinine Ratio, Urine, POC: <30
Creatinine, POC: 200 mg/dL
Microalbumin Ur, POC: 30 mg/L

## 2024-03-08 LAB — POCT GLYCOSYLATED HEMOGLOBIN (HGB A1C)
HbA1c, POC (controlled diabetic range): 5.4 % (ref 0.0–7.0)
Hemoglobin A1C: 5.4 % (ref 4.0–5.6)

## 2024-09-05 ENCOUNTER — Ambulatory Visit: Payer: Self-pay | Admitting: Medical-Surgical
